# Patient Record
Sex: Male | Born: 1960 | Race: White | Hispanic: No
Health system: Midwestern US, Community
[De-identification: ages and names within clinical notes are randomized; demographics above are authoritative.]

## PROBLEM LIST (undated history)

## (undated) DIAGNOSIS — M419 Scoliosis, unspecified: Secondary | ICD-10-CM

## (undated) DIAGNOSIS — S37019A Minor contusion of unspecified kidney, initial encounter: Secondary | ICD-10-CM

## (undated) DIAGNOSIS — Z8719 Personal history of other diseases of the digestive system: Secondary | ICD-10-CM

## (undated) DIAGNOSIS — K219 Gastro-esophageal reflux disease without esophagitis: Secondary | ICD-10-CM

## (undated) DIAGNOSIS — M549 Dorsalgia, unspecified: Secondary | ICD-10-CM

## (undated) DIAGNOSIS — G8929 Other chronic pain: Secondary | ICD-10-CM

## (undated) DIAGNOSIS — I10 Essential (primary) hypertension: Secondary | ICD-10-CM

## (undated) DIAGNOSIS — E785 Hyperlipidemia, unspecified: Secondary | ICD-10-CM

## (undated) DIAGNOSIS — E119 Type 2 diabetes mellitus without complications: Secondary | ICD-10-CM

## (undated) DIAGNOSIS — J449 Chronic obstructive pulmonary disease, unspecified: Secondary | ICD-10-CM

## (undated) DIAGNOSIS — R3129 Other microscopic hematuria: Secondary | ICD-10-CM

## (undated) DIAGNOSIS — R1031 Right lower quadrant pain: Secondary | ICD-10-CM

## (undated) DIAGNOSIS — M2550 Pain in unspecified joint: Secondary | ICD-10-CM

## (undated) DIAGNOSIS — M5020 Other cervical disc displacement, unspecified cervical region: Secondary | ICD-10-CM

## (undated) DIAGNOSIS — R52 Pain, unspecified: Secondary | ICD-10-CM

## (undated) DIAGNOSIS — R0602 Shortness of breath: Secondary | ICD-10-CM

## (undated) DIAGNOSIS — Z125 Encounter for screening for malignant neoplasm of prostate: Secondary | ICD-10-CM

## (undated) DIAGNOSIS — IMO0002 Reserved for concepts with insufficient information to code with codable children: Secondary | ICD-10-CM

## (undated) DIAGNOSIS — S83289A Other tear of lateral meniscus, current injury, unspecified knee, initial encounter: Secondary | ICD-10-CM

## (undated) DIAGNOSIS — I82401 Acute embolism and thrombosis of unspecified deep veins of right lower extremity: Secondary | ICD-10-CM

## (undated) DIAGNOSIS — M75101 Unspecified rotator cuff tear or rupture of right shoulder, not specified as traumatic: Secondary | ICD-10-CM

## (undated) DIAGNOSIS — R072 Precordial pain: Secondary | ICD-10-CM

## (undated) HISTORY — PX: KNEE ARTHROPLASTY: SHX992

## (undated) HISTORY — PX: SHOULDER ARTHROSCOPY: SHX128

## (undated) HISTORY — PX: KNEE ARTHROSCOPY: SUR90

## (undated) HISTORY — PX: BACK SURGERY: SHX140

## (undated) HISTORY — PX: JOINT REPLACEMENT: SHX530

## (undated) HISTORY — PX: TOTAL SHOULDER ARTHROPLASTY: SHX126

## (undated) HISTORY — PX: HERNIA REPAIR: SHX51

## (undated) HISTORY — PX: CARDIAC CATHETERIZATION: SHX172

---

## 2009-02-22 LAB — URINALYSIS W/O MICRO
Bilirubin: NEGATIVE
Blood: NEGATIVE
Glucose: NEGATIVE MG/DL
Ketone: NEGATIVE MG/DL
Leukocyte Esterase: NEGATIVE
Nitrites: NEGATIVE
Protein: NEGATIVE MG/DL
Specific gravity: 1.01 (ref 1.002–1.030)
Urobilinogen: 0.2 EU/DL (ref 0–1)
pH (UA): 8 (ref 4.5–8.0)

## 2009-02-22 LAB — METABOLIC PANEL, BASIC
Anion gap: 3 mmol/L — ABNORMAL LOW (ref 6–15)
BUN/Creatinine ratio: 11 (ref 7–25)
BUN: 12 MG/DL (ref 7–18)
CO2: 29 MMOL/L (ref 21–32)
Calcium: 8.9 MG/DL (ref 8.5–10.1)
Chloride: 104 MMOL/L (ref 98–107)
Creatinine: 1.1 MG/DL (ref 0.0–1.3)
GFR est AA: >60 mL/min/{1.73_m2} (ref >60)
GFR est non-AA: >60 mL/min/{1.73_m2} (ref >60)
Glucose: 117 MG/DL — ABNORMAL HIGH (ref 70–110)
Potassium: 3.9 MMOL/L (ref 3.5–5.1)
Sodium: 135 MMOL/L — ABNORMAL LOW (ref 136–145)

## 2009-02-22 LAB — CBC WITH AUTOMATED DIFF
ABS. BASOPHILS: 0 10*3/uL (ref 0.0–0.1)
ABS. EOSINOPHILS: 0 10*3/uL (ref 0.0–0.5)
ABS. LYMPHOCYTES: 1.1 10*3/uL (ref 0.8–3.5)
ABS. MONOCYTES: 0.3 10*3/uL — ABNORMAL LOW (ref 0.8–3.5)
ABS. NEUTROPHILS: 7.2 10*3/uL (ref 1.5–8.0)
BASOPHILS: 0 % (ref 0–2)
EOSINOPHILS: 0 % (ref 0–5)
HCT: 47.4 % (ref 41–53)
HGB: 16.4 g/dL (ref 13.5–17.5)
LYMPHOCYTES: 13 % — ABNORMAL LOW (ref 19–48)
MCH: 30.2 PG (ref 27–31)
MCHC: 34.5 g/dL (ref 31–37)
MCV: 87.6 FL (ref 80–100)
MONOCYTES: 4 % (ref 3–9)
MPV: 8.1 FL (ref 5.9–10.3)
NEUTROPHILS: 83 % — ABNORMAL HIGH (ref 40–74)
PLATELET: 198 10*3/uL (ref 130–400)
RBC: 5.41 M/uL (ref 4.7–6.1)
RDW: 13.5 % (ref 11.5–14.5)
WBC: 8.7 10*3/uL (ref 4.5–10.8)

## 2009-09-14 LAB — CK-MB,QUANT.
CK - MB: 3.3 NG/ML (ref 0.0–5.0)
CK-MB Index: 1.8 (ref 0–4)

## 2009-09-14 LAB — CBC WITH AUTOMATED DIFF
ABS. BASOPHILS: 0.1 10*3/uL (ref 0.0–0.1)
ABS. EOSINOPHILS: 0.1 10*3/uL (ref 0.0–0.5)
ABS. LYMPHOCYTES: 2.4 10*3/uL (ref 0.8–3.5)
ABS. MONOCYTES: 0.8 10*3/uL (ref 0.8–3.5)
ABS. NEUTROPHILS: 7.3 10*3/uL (ref 1.5–8.0)
BASOPHILS: 1 % (ref 0–2)
EOSINOPHILS: 1 % (ref 0–5)
HCT: 50.2 % (ref 41–53)
HGB: 17.7 g/dL — ABNORMAL HIGH (ref 13.5–17.5)
LYMPHOCYTES: 22 % (ref 19–48)
MCH: 30.5 PG (ref 27–31)
MCHC: 35.3 g/dL (ref 31–37)
MCV: 86.5 FL (ref 80–100)
MONOCYTES: 8 % (ref 3–9)
MPV: 8.6 FL (ref 5.9–10.3)
NEUTROPHILS: 68 % (ref 40–74)
PLATELET: 218 10*3/uL (ref 130–400)
RBC: 5.8 M/uL (ref 4.7–6.1)
RDW: 13.2 % (ref 11.5–14.5)
WBC: 10.7 10*3/uL (ref 4.5–10.8)

## 2009-09-14 LAB — METABOLIC PANEL, BASIC
Anion gap: 4 mmol/L — ABNORMAL LOW (ref 6–15)
BUN/Creatinine ratio: 17 (ref 7–25)
BUN: 20 MG/DL — ABNORMAL HIGH (ref 7–18)
CO2: 31 MMOL/L (ref 21–32)
Calcium: 9.2 MG/DL (ref 8.5–10.1)
Chloride: 101 MMOL/L (ref 98–107)
Creatinine: 1.2 MG/DL (ref 0.6–1.3)
GFR est AA: >60 mL/min/{1.73_m2} (ref >60)
GFR est non-AA: >60 mL/min/{1.73_m2} (ref >60)
Glucose: 92 MG/DL (ref 70–110)
Potassium: 4.1 MMOL/L (ref 3.5–5.3)
Sodium: 136 MMOL/L (ref 136–145)

## 2009-09-14 LAB — TSH 3RD GENERATION: TSH: 0.84 u[IU]/mL (ref 0.35–3.74)

## 2009-09-14 LAB — CARDIAC PROFILE
CK: 181 U/L (ref 39–308)
CK: 115 U/L (ref 39–308)
Troponin-I, Qt.: <0.02 ng/mL (ref 0.00–0.05)
Troponin-I, Qt.: <0.02 ng/mL (ref 0.00–0.05)

## 2009-09-14 LAB — MAGNESIUM: Magnesium: 2.1 MG/DL (ref 1.8–2.4)

## 2009-09-14 NOTE — ED Notes (Unsigned)
[facility]      PT Name: Jesse Montoya, Jesse Montoya Admitted: 09/14/2009  MR#: [id number] DOB: 08/12/1960  Account #: [id number] Age: 49  Dictator: Farrel Gobble, M.D. Location: 3C 323 01      EMERGENCY DEPARTMENT ADMISSION    CHIEF COMPLAINT:  Questionable stroke.    HISTORY OF PRESENT ILLNESS:  49 year old male who states two days ago he had an episode where he  developed some numbness on the left arm and some numbness on the  right side of his face. He had some trouble with speech. He felt  confused, nauseated. He says his symptoms have resolved other than  some numbness and tingling in the right side of the face.  Questionable mild headache. No chest pain. Some nausea. He never  had any focal weakness. Symptoms were mild.    PAST MEDICAL HISTORY:  Hypertension.    SOCIAL HISTORY:  Positive smoking.    FAMILY HISTORY:  Noncontributory.    VITAL SIGNS:  Within normal limits.    REVIEW OF SYSTEMS:  No fever, no vomiting. Positive nausea. No chest pain, not short  of breath. All other systems reviewed and negative except as  mentioned above.    PHYSICAL EXAMINATION:  GENERAL: The patient is alert, answers questions appropriately, in  no distress. HEAD: Normocephalic and atraumatic. EYES: Sclerae  nonicteric. No discharge. NECK: Supple, no JVD. No carotid bruits  bilaterally. SKIN: No rashes. JOINTS: Nonswollen. HEART:  Regular rate, rhythm, no murmur, gallop or rub. LUNGS: Clear to  auscultation bilaterally with equal breath sounds bilaterally.  ABDOMEN: Bowel sounds positive, soft, nontender to palpation,  nondistended, no rebound, no guarding, no masses, no peritoneal  signs. EXTREMITIES: No cyanosis, no clubbing, no edema.  NEUROLOGICAL EXAM: Cranial nerves II through XII reveals normal  function except for a slight decreased in light touch sensation on  the right side of the face. Normal on the left. Equal grip strength  bilaterally. Equal hip flexion strength bilaterally.    LABORATORY DATA:   CBC within normal limits. Electrolytes show no significant  abnormalities. Cardiac enzymes pending at the time of dictation.  EKG shows sinus rhythm, no acute signs of ischemia. QRS complexes  are narrow. No ST segment elevations. Heart rate 100. CT scan of  the head with no contrast negative.    HOSPITAL COURSE:  The patient was given aspirin. Condition was discussed with Dr. Leretha Dykes who agreed to admit.    DIAGNOSIS:  Transient ischemic attack.    DISPOSITION:  The patient was admitted to the floor for further evaluation and  treatment.        __________________________________  Farrel Gobble, M.D.    BV:sp DD: 09/14/2009 08:11:45 DT: 09/14/2009 09:35:39 Job ID:  [id number]  CC:          Document #: [id number]

## 2009-09-14 NOTE — H&P (Signed)
[facility]      PT Name: Jesse Montoya, Jesse Montoya Admitted: 09/14/2009  MR#: [id number] DOB: [birth year]  Account #: [id number] Age: 49  Dictator: Manfred Arch, MD Location: 3C 323 01        HISTORY AND PHYSICAL    CHIEF COMPLAINT:  Numbness, tingling right arm, chest tightness.    HISTORY OF PRESENT ILLNESS:  This is the case of a 49 year old male, White man, who came into the  hospital with a two day history of right arm pain, right side face  numbness, tingling, right arm numbness and tingling, tingling around  the mouth and some subconjunctival hemorrhage. The patient said the  story started two days. He had some blood in his eye, right  conjunctival hemorrhage which resolved right away and then after  that, he started to have right arm numbness, tingling and then that  transferred around his mouth and then to his left side. He also  described chest tightness without any pain. He said there was  association with lightheadedness. Nothing makes his symptoms worse  and nothing makes it better. He had lightheadedness without any  black out and loss of consciousness. Other than that he denies any  nausea, vomiting, diarrhea, shortness of breath, sweating or any  other associated symptoms.    PAST MEDICAL HISTORY:  Significant for:  1. Hypertension.  2. Hyperlipidemia.  3. Chronic back pain.  4. Gastroesophageal reflux disease.  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    MEDICATIONS:  He is not sure of his medication.  1. He said he is taking gastroesophageal reflux disease pills, blood  pressure medication. He is not sure of the name.  2. Cholesterol pills-he is not sure of the name.  3. Lortab 7.5.  4. Gabapentin 800 mg orally four times a day.  5. Flexeril orally three times a day.    ALLERGIES:  HE IS ALLERGIC TO DARVOCET, PERCOCET, ROBAXIN AND TORADOL.    SURGICAL HISTORY:  Right arm surgical history.    FAMILY HISTORY:  Significant for brain tumor and also his dad has history of stroke.   His brother had a brain tumor at a younger age.        REVIEW OF SYSTEMS:  Comprehensive ten point review of systems was done. It was negative  except for what was mentioned in the History and Physical.    PHYSICAL EXAMINATION:  GENERAL: The patient is not in apparent distress, having his  breakfast. VITAL SIGNS: His vitals are the following- blood  pressure 143/70, heart rate 106, respiratory rate 16, temperature  98.4, satting 94% on room air. HEAD, EYES, EARS, NOSE AND THROAT:  Pupils are equal, round and reactive to light and accommodation.  Extraocular movements are intact. CARDIOVASCULAR: S1/S2, no  murmur, rub or gallop, regular rate and rhythm. CHEST: Clear to  auscultation bilaterally. Normal respiratory efforts. ABDOMEN: Soft,  no tenderness, no rigidity, no hepatosplenomegaly. GENITOURINARY:  No CVA, no suprapubic tenderness. MUSCULOSKELETAL: No joint  deformity, no swelling. NEUROLOGICAL: Cranial nerves II-XII intact.  Strength 5/5 all extremities. Deep reflexes +2, symmetrical.  GENITOURINARY: No CVA, no suprapubic tenderness.  HEMATOLOGY/ONCOLOGY: No splenomegaly, no petechiae.    DIAGNOSTIC STUDIES:  Blood workup done for him-CT scan negative. WBC 10.7, hemoglobin  17.7, platelets are 218. Calcium 9.2, glucose 92, Sodium 136,  Potassium is 4.1 and Chloride 101, CO2 is 31. CK 181, CKMB 30.3.  Troponin less than 0.02. Cardiac enzymes-first set negative.  Magnesium is  normal. EKG-no significant changes. Chest x-ray not  done, we will do it.    ASSESSMENT AND PLAN:  1. Questionable Transient ischemic attack. I am going to do MRI for  the patient, carotid ultrasound and Two-D echo.  2. Chest tightness, rule out coronary artery disease. I am going to  do an exercise stress test with nuclear imaging.  3. For the transient ischemic attack will put the patient on Aspirin  325 daily. I will check fasting lipid profile. I will check cycling  his cardiac enzymes. I will check his thyroid stimulating hormone.   I will check his electrolytes, sed rate.  4. History of hypertension. We will confirm with pharmacist the  medication and will resume it. For now, I will put him on  Labetalol.  5. Hypercholesterolemia. Will check cholesterol level. Will check  his medication.  6. Deep venous thrombosis prophylaxis. I will put him on Lovenox.  7. Patient will be admitted to telemetry.         Electronically signed by Manfred Arch, M.D.  on 09/25/2009 01:25:03      _______________________________  Manfred Arch, M.D.    MA:ls DD: 09/14/2009 10:43:28 DT: 09/15/2009 07:46:11 Job ID:  [id number]  CC:          Document #: [id number]

## 2009-09-15 LAB — CARDIAC PROFILE
CK: 100 U/L (ref 39–308)
Troponin-I, Qt.: <0.02 ng/mL (ref 0.00–0.05)

## 2009-09-15 LAB — LIPID PANEL
Cholesterol, total: 138 MG/DL (ref <200)
HDL Cholesterol: 28 MG/DL — ABNORMAL LOW (ref 32–96)
LDL, calculated: 84.24 MG/DL (ref <130)
Triglyceride: 161 MG/DL — ABNORMAL HIGH (ref <150)
VLDL, calculated: 32.2 MG/DL — ABNORMAL HIGH (ref 5–32)

## 2009-09-15 LAB — METABOLIC PANEL, COMPREHENSIVE
A-G Ratio: 1.2 (ref 1.2–2.2)
ALT (SGPT): 39 U/L (ref 30–65)
AST (SGOT): 19 U/L (ref 15–37)
Albumin: 3.5 g/dL (ref 3.4–5.0)
Alk. phosphatase: 64 U/L (ref 50–136)
Anion gap: 8 mmol/L (ref 6–15)
BUN/Creatinine ratio: 17 (ref 7–25)
BUN: 17 MG/DL (ref 7–18)
Bilirubin, total: 0.3 MG/DL (ref 0.2–1.0)
CO2: 27 MMOL/L (ref 21–32)
Calcium: 8.4 MG/DL — ABNORMAL LOW (ref 8.5–10.1)
Chloride: 106 MMOL/L (ref 98–107)
Creatinine: 1 MG/DL (ref 0.6–1.3)
GFR est AA: >60 mL/min/{1.73_m2} (ref >60)
GFR est non-AA: >60 mL/min/{1.73_m2} (ref >60)
Globulin: 2.9 g/dL (ref 2.4–3.5)
Glucose: 97 MG/DL (ref 70–110)
Potassium: 3.8 MMOL/L (ref 3.5–5.3)
Protein, total: 6.4 g/dL (ref 6.4–8.2)
Sodium: 141 MMOL/L (ref 136–145)

## 2009-09-15 LAB — CBC WITH AUTOMATED DIFF
ABS. BASOPHILS: 0 10*3/uL (ref 0.0–0.1)
ABS. EOSINOPHILS: 0.1 10*3/uL (ref 0.0–0.5)
ABS. LYMPHOCYTES: 2.1 10*3/uL (ref 0.8–3.5)
ABS. MONOCYTES: 0.4 10*3/uL — ABNORMAL LOW (ref 0.8–3.5)
ABS. NEUTROPHILS: 2.8 10*3/uL (ref 1.5–8.0)
BASOPHILS: 1 % (ref 0–2)
EOSINOPHILS: 2 % (ref 0–5)
HCT: 47.3 % (ref 41–53)
HGB: 16.4 g/dL (ref 13.5–17.5)
LYMPHOCYTES: 38 % (ref 19–48)
MCH: 30.6 PG (ref 27–31)
MCHC: 34.7 g/dL (ref 31–37)
MCV: 88.2 FL (ref 80–100)
MONOCYTES: 7 % (ref 3–9)
MPV: 8.4 FL (ref 5.9–10.3)
NEUTROPHILS: 52 % (ref 40–74)
PLATELET: 162 10*3/uL (ref 130–400)
RBC: 5.36 M/uL (ref 4.7–6.1)
RDW: 13.4 % (ref 11.5–14.5)
WBC: 5.4 10*3/uL (ref 4.5–10.8)

## 2009-09-15 LAB — SED RATE (ESR): Sed rate (ESR): 1 MM/HR (ref 0–15)

## 2009-09-15 LAB — PHOSPHORUS: Phosphorus: 3.4 MG/DL (ref 2.5–4.9)

## 2009-09-15 LAB — MAGNESIUM: Magnesium: 2.3 MG/DL (ref 1.8–2.4)

## 2009-09-15 NOTE — Procedures (Signed)
Procedures  filed by Precious Reel at 09/16/09 1258                Author: Precious Reel  Service: --  Author Type: Physician       Filed: 09/16/09 1258  Date of Service: 09/15/09 1229  Status: Addendum          Editor: Precious Reel            Procedure Orders        1. 2D ECHO COMPLETE ADULT [[id number]] ordered by  at 09/15/09 1229                         <!--EPICS-->                           [facility]<BR> <BR> <BR> PT Name:  Jesse, Montoya           Admitted:  09/14/2009<BR> MR#:  [id number]                      DOB:  07-05-1960<BR> Account #:  [id number]             Age:  49<BR> Dictator:  Precious Reel, MD       Location:  3C 323 01<BR> <BR> <BR> Age: 4Y  Room #:   323<BR> Performing Technician:    Marcellina Millin    Interpreting Physician:<BR>  Nasif Bos<BR> <BR>  M-MODE AND TWO DIMENSIONAL<BR> ECHOCARDIOGRAPHIC REPORT<BR> <BR> CLINICAL INFORMATION:  Transient ischemic attack<BR> <BR> DimensionsNormal C.M.DimensionsNormal C.M.Mitral<BR> ValueNormal C.M.RVID(ed)2.30.9 - 2.6IVS(ed)0.90.6  - 1.1E-F slope2.5<BR> - 11.7LIVD(ed)4.13.5 - 5.7IVS(es)1.30.8 - 2.0Excursion1.0 -<BR> 2.6LIVD(es)2.6LVPW(ed)0.80.6 - 1.1Aortic Cusp Separation2.41.5 -<BR> 2.6LA(es)3.61.9 - 4.0LVPW(es)1.40.9 - 2.1E Point<BR> Separation.07LVOT2.52.0 - 4.0E F50-55%50% or  &gt;AORTA2.32.0 -<BR> 3.7(ed)Ratio1.5Fractional shortening25% - 52%<BR> FINDINGS:<BR> The study is of good diagnostic quality.  The left and right atria<BR> are within normal limits in size.  The right ventricle is within<BR> normal size and functions  normally.  The left ventricle is within<BR> normal size and functions normally.  The ejection fraction is 55%.<BR> There is no left ventricular hypertrophy or outflow tract<BR> obstruction.  E to A ratio is reversed.<BR> <BR> Tricuspid valve is structurally  normal.  There is mild tricuspid<BR> regurgitation.  Right ventricular systolic pressure is within normal<BR> limits.  Pulmonic valve is  structurally normal.  There is trace<BR> pulmonic insufficiency.  The mitral leaflets are structurally<BR> normal.   There is trace mitral insufficiency.  Aortic valve is<BR> trileaflet and functions normally.  There is no pericardial<BR> effusion, tumor, thrombus or vegetation.  Aortic root is within<BR> normal limits in size.<BR> <BR> CONCLUSION:<BR> 1. Normal left  ventricular systolic function.  Ejection fraction is<BR> 55%.<BR> 2. Mild tricuspid insufficiency with normal right ventricular<BR> systolic pressure.<BR> 3. Trace mitral insufficiency.<BR> 4. Mild grade I left ventricular diastolic dysfunction without<BR>  evidence of left ventricular hypertrophy or outflow tract<BR> obstruction.<BR> <BR> <BR>  Electronically signed by Precious Reel, M.D.<BR> on 09/16/2009 12:57:35<BR> <BR> <BR> ___________________________________<BR> Precious Reel, M.D.<BR> <BR> TD:ls   DD: 09/15/2009 12:29:36  DT: 09/16/2009 10:35:41  Job ID:<BR> [id number]<BR> <BR> CC:<BR> <BR> <BR> <BR> <BR> Document #:  [id number]<BR> <!--EPICE-->

## 2009-09-15 NOTE — Procedures (Signed)
Procedures  filed by Elayne Snare at 09/16/09 1257                Author: Elayne Snare  Service: --  Author Type: Physician       Filed: 09/16/09 1257  Date of Service: 09/15/09 0000  Status: Addendum          Editor: Elayne Snare            Procedure Orders        1. STRESS TEST CARDIAC [[id number]] ordered by  at 09/15/09 0000                         <!--EPICS-->                           [facility]<BR> <BR> <BR> PT Name:  Jesse Montoya, Jesse Montoya           Admitted:  09/14/2009<BR> MR#:  [id number]                      DOB:  March 15, 1960<BR> Account #:  [id number]             Age:  49<BR> Dictator:  Elayne Snare, MD      Location:  3C 323 01<BR> <BR> <BR> <BR>                 Cardiology - GXT - DRUG INDUCED TEST<BR> Reason for GXT:  Transient ischemic  attack/chest pain.<BR> Medications:   See list.<BR> Allergies:     Darvocet, Percocet, Robaxin, Toradol, Vicodin.<BR> INTERPRETATION OF STRESS TEST:<BR> Protocol:      Lexiscan Myoview    Age: 49Y  Height:   6'0&quot; Weight:<BR> 215  Sex: Male Race:<BR>  <BR> * * * Lexiscan 0.4  MG ADMIN. * * *<BR> <BR> BLOOD PRESSURE      HEART RATE               MET<BR> REST:  110/75                 REST:  82<BR> 1 MIN:    120/70              95<BR> 2 MIN: 120/70            108<BR> 3 MIN: 120/70            106<BR> 4  MIN: 120/80            106<BR> MIN:<BR> PEAK:                    PEAK:<BR> <BR> * * * POST EXERCISE RESPONSE * * *<BR> <BR> BLOOD PRESSURE      HEART RATE<BR> 1 MIN:    120/80              100<BR> 2 MIN: 120/80            100<BR> 3 MIN: 120/80             96<BR> 4 MIN:    124/80              97<BR> <BR> <BR> HEART - - PREDICTED MHR:      MHR ACHIEVED:   %   PEAK HR ACHIEVED:<BR> TOTAL TIME:         TARGET (85% MHR):        STAGE:         TIME:<BR> <  BR> SYMPTOMS/REASON FOR STOPPING: No chest pain.<BR>  PRE-EXERCISE ECG:   Sinus rhythm, normal.<BR> EKG RESPONSE:  No significant ST segment changes noted.   No<BR> arrhythmias.<BR> PEAK ACTIVITY  LEVEL:       METS.   MAXIMUM 02 CONSUMPTION (VO2):<BR> INDEX OF CARDIAC WORK:        FITNESS CLASSIFICATION:<BR>  <BR> INTERPRETATION:<BR> 1. Negative electrocardiographic test in response to Lexiscan.<BR> 2. Myoview distribution report is pending.<BR> <BR> <BR>  Electronically signed by Precious Reel, M.D.<BR> on 09/16/2009 12:56:39<BR> <BR> _______________________<BR>  Precious Reel, M.D.<BR> <BR> TD:ls  DD: 09/15/2009 00:00:00  DT:  09/15/2009 13:51:07<BR> Corrected report 09/15/2009/ls<BR> CC:<BR> <BR> <BR> <BR> <BR> Document #:  [id number]<BR> <!--EPICE-->

## 2009-09-15 NOTE — Procedures (Addendum)
[facility]      PT Name: Jesse Montoya, Jesse Montoya Admitted: 09/14/2009  MR#: [id number] DOB: [birth year]  Account #: [id number] Age: 49  Dictator: Elayne Snare, MD Location: 3C 323 01         Cardiology - GXT - DRUG INDUCED TEST  Reason for GXT: Transient ischemic attack/chest pain.  Medications: See list.  Allergies: Darvocet, Percocet, Robaxin, Toradol, Vicodin.  INTERPRETATION OF STRESS TEST:  Protocol: Lexiscan Myoview Age: 49Y Height: 6'0" Weight:  215 Sex: Male Race:    * * * Lexiscan 0.4 MG ADMIN. * * *    BLOOD PRESSURE HEART RATE MET  REST: 110/75 REST: 82  1 MIN: 120/70 95  2 MIN: 120/70 108  3 MIN: 120/70 106  4 MIN: 120/80 106  MIN:  PEAK: PEAK:    * * * POST EXERCISE RESPONSE * * *    BLOOD PRESSURE HEART RATE  1 MIN: 120/80 100  2 MIN: 120/80 100  3 MIN: 120/80 96  4 MIN: 124/80 97      HEART - - PREDICTED MHR: MHR ACHIEVED: % PEAK HR ACHIEVED:  TOTAL TIME: TARGET (85% MHR): STAGE: TIME:    SYMPTOMS/REASON FOR STOPPING: No chest pain.  PRE-EXERCISE ECG: Sinus rhythm, normal.  EKG RESPONSE: No significant ST segment changes noted. No  arrhythmias.  PEAK ACTIVITY LEVEL: METS. MAXIMUM 02 CONSUMPTION (VO2):  INDEX OF CARDIAC WORK: FITNESS CLASSIFICATION:    INTERPRETATION:  1. Negative electrocardiographic test in response to Lexiscan.  2. Myoview distribution report is pending.       Electronically signed by Precious Reel, M.D.  on 09/16/2009 12:56:39    _______________________  Precious Reel, M.D.    TD:ls DD: 09/15/2009 00:00:00 DT: 09/15/2009 13:51:07  Corrected report 09/15/2009/ls  CC:          Document #: [id number]

## 2009-09-15 NOTE — Discharge Summary (Signed)
[facility]      PT Name: Jesse Montoya, Jesse Montoya Admitted: 09/14/2009  MR#: [id number] DOB: 1960/07/30  Account #: [id number] Age: 49  Dictator: Manfred Arch, MD Location: 3C 323 01      DISCHARGE SUMMARY      ADMITTING DIAGNOSES:  1. Questionable transient ischemic attack.  2. Chest tightness.  3. Rule out coronary artery disease.  4. History of hypertension.  5. Hypercholesterolemia.  6. Chronic back pain.    DISCHARGE DIAGNOSES:  1. Atypical numbness and tingling, transient ischemic attack has  been ruled out with negative carotid ultrasound, negative magnetic  resonance imaging, and negative stress test, Two-D echocardiogram  pending.  2. Hypertension.  3. Dyslipidemia.  4. Chronic back pain with request for pain medication and drug  seeking behavior.    HISTORY OF PRESENT ILLNESS AND HOSPITAL COURSE:  The patient is a 36 -year-old white male who came into the hospital  with a two days history of right arm pain, numbness, and tingling.  The patient mentioned that his symptoms improved within twenty-four  hours after he resumed his pain medication. The patient had an MRI  of the brain which did not show any abnormality except sinusitis  which he has had for a long time. Also a stress test has been done  for him which the results are pending - please refer to the results.  Also a Two-D echocardiogram has been done. Carotid ultrasound has  been done that was negative. A decision was made to discharge the  patient if the stress test is negative - please refer to the stress  test results.    DISCHARGE MEDICATIONS:  1. Lortab 7/325 mg which is home medication but is written only for  seven days.  2. Flexeril 10 mg b.i.d.  3. Famotidine 20 mg q daily.  4. Lisinopril 10 mg q a.m.  5. Nexium q daily.  6. TriCor 54 mg q a.m.    FOLLOWUP INSTRUCTIONS:  1. The patient needs to followup with is primary care physician.  2. The patient was advised to quit smoking and we recommended   programs for him. He is willing to do that as an outpatient.  3. 2 D echo should be followed with PCP which show Grade I diastolic  dysfunction          PHYSICAL EXAMINATION:  Today the patient is doing very well. Heart rate 83, temperature  97, respiratory rate 18, pulse oximeter 97%, and blood pressure  110/75. HEENT: Pupils equal, round and reactive to light and  accommodation. Extraocular movements are intact. CARDIOVASCULAR:  S1 and S2; no murmur, rub, or gallop; regular rate and rhythm.  CHEST: Clear to auscultation, normal respiratory efforts. ABDOMEN:  Soft, non tenderness, no rigidity. NEUROLOGIC: Cranial nerves  II-XII intact, strength 5/5 in all extremities.           Electronically signed by Manfred Arch, M.D.  on 09/25/2009 01:25:00      __________________________________  Manfred Arch, M.D.    [facility] DD: 09/15/2009 12:26:15 DT: 09/19/2009 10:43:19 Job ID:  [id number]  CC:            Document #: [id number]

## 2009-09-15 NOTE — Procedures (Addendum)
[facility]      PT Name: Jesse Montoya, Jesse Montoya Admitted: 09/14/2009  MR#: [id number] DOB: 06/28/1960  Account #: [id number] Age: 49  Dictator: Precious Reel, MD Location: 3C 323 01      Age: 49Y Room #: 323  Performing Technician: Marcellina Millin Interpreting Physician:  Precious Reel     M-MODE AND TWO DIMENSIONAL  ECHOCARDIOGRAPHIC REPORT    CLINICAL INFORMATION: Transient ischemic attack    DimensionsNormal C.M.DimensionsNormal C.M.Mitral  ValueNormal C.M.RVID(ed)2.30.9 - 2.6IVS(ed)0.90.6 - 1.1E-F slope2.5  - 11.7LIVD(ed)4.13.5 - 5.7IVS(es)1.30.8 - 2.0Excursion1.0 -  2.6LIVD(es)2.6LVPW(ed)0.80.6 - 1.1Aortic Cusp Separation2.41.5 -  2.6LA(es)3.61.9 - 4.0LVPW(es)1.40.9 - 2.1E Point  Separation.07LVOT2.52.0 - 4.0E F50-55%50% or >AORTA2.32.0 -  3.7(ed)Ratio1.5Fractional shortening25% - 52%  FINDINGS:  The study is of good diagnostic quality. The left and right atria  are within normal limits in size. The right ventricle is within  normal size and functions normally. The left ventricle is within  normal size and functions normally. The ejection fraction is 55%.  There is no left ventricular hypertrophy or outflow tract  obstruction. E to A ratio is reversed.    Tricuspid valve is structurally normal. There is mild tricuspid  regurgitation. Right ventricular systolic pressure is within normal  limits. Pulmonic valve is structurally normal. There is trace  pulmonic insufficiency. The mitral leaflets are structurally  normal. There is trace mitral insufficiency. Aortic valve is  trileaflet and functions normally. There is no pericardial  effusion, tumor, thrombus or vegetation. Aortic root is within  normal limits in size.    CONCLUSION:  1. Normal left ventricular systolic function. Ejection fraction is  55%.  2. Mild tricuspid insufficiency with normal right ventricular  systolic pressure.  3. Trace mitral insufficiency.  4. Mild grade I left ventricular diastolic dysfunction without   evidence of left ventricular hypertrophy or outflow tract  obstruction.       Electronically signed by Precious Reel, M.D.  on 09/16/2009 12:57:35      ___________________________________  Precious Reel, M.D.    TD:ls DD: 09/15/2009 12:29:36 DT: 09/16/2009 10:35:41 Job ID:  [id number]    CC:          Document #: [id number]

## 2009-09-19 LAB — CULTURE, BLOOD,  2ND DRAW: Culture result:: NO GROWTH

## 2009-09-19 LAB — CULTURE, BLOOD: Culture result:: NO GROWTH

## 2009-10-16 ENCOUNTER — Encounter

## 2009-10-30 MED ADMIN — methacholine (PROVOCHOLINE) 0.25 mg/ml inhalation solution 0.25 mg: RESPIRATORY_TRACT | @ 17:00:00 | NDC 99999781101

## 2009-10-30 MED ADMIN — methacholine (PROVOCHOLINE) 25 mg/ml inhalation solution 25 mg: RESPIRATORY_TRACT | @ 17:00:00 | NDC 99999781125

## 2009-10-30 MED ADMIN — methacholine (provocholine) 0.025 mg/ml inhalation solution 0.025 mg: RESPIRATORY_TRACT | @ 16:00:00 | NDC 99999781100

## 2009-10-30 MED ADMIN — methacholine (PROVOCHOLINE) 2.5 mg/ml inhalation solution 2.5 mg: RESPIRATORY_TRACT | @ 17:00:00 | NDC 99999781112

## 2009-10-30 MED ADMIN — methacholine (PROVOCHOLINE) 10 mg/ml inhalation solution 10 mg: RESPIRATORY_TRACT | @ 17:00:00 | NDC 99999781150

## 2009-11-06 LAB — DRUG SCREEN, URINE
AMPHETAMINES: NEGATIVE
BARBITURATES: NEGATIVE
BENZODIAZEPINES: NEGATIVE
COCAINE: NEGATIVE
METHADONE: NEGATIVE
OPIATES: POSITIVE
PCP(PHENCYCLIDINE): NEGATIVE
THC (TH-CANNABINOL): NEGATIVE
TRICYCLICS: NEGATIVE

## 2009-11-06 LAB — METABOLIC PANEL, BASIC
Anion gap: 5 mmol/L — ABNORMAL LOW (ref 6–15)
BUN/Creatinine ratio: 13 (ref 7–25)
BUN: 14 MG/DL (ref 7–18)
CO2: 29 MMOL/L (ref 21–32)
Calcium: 9.3 MG/DL (ref 8.5–10.1)
Chloride: 103 MMOL/L (ref 98–107)
Creatinine: 1.1 MG/DL (ref 0.6–1.3)
GFR est AA: >60 mL/min/{1.73_m2} (ref >60)
GFR est non-AA: >60 mL/min/{1.73_m2} (ref >60)
Glucose: 110 MG/DL (ref 70–110)
Potassium: 4.2 MMOL/L (ref 3.5–5.3)
Sodium: 137 MMOL/L (ref 136–145)

## 2009-11-06 LAB — CBC WITH AUTOMATED DIFF
ABS. BASOPHILS: 0 10*3/uL (ref 0.0–0.1)
ABS. EOSINOPHILS: 0.1 10*3/uL (ref 0.0–0.5)
ABS. LYMPHOCYTES: 1.7 10*3/uL (ref 0.8–3.5)
ABS. MONOCYTES: 0.6 10*3/uL — ABNORMAL LOW (ref 0.8–3.5)
ABS. NEUTROPHILS: 4.6 10*3/uL (ref 1.5–8.0)
BASOPHILS: 1 % (ref 0–2)
EOSINOPHILS: 2 % (ref 0–5)
HCT: 46.1 % (ref 41–53)
HGB: 16.1 g/dL (ref 13.5–17.5)
LYMPHOCYTES: 24 % (ref 19–48)
MCH: 30.8 PG (ref 27–31)
MCHC: 34.9 g/dL (ref 31–37)
MCV: 88.4 FL (ref 80–100)
MONOCYTES: 8 % (ref 3–9)
MPV: 8.1 FL (ref 5.9–10.3)
NEUTROPHILS: 66 % (ref 40–74)
PLATELET: 183 10*3/uL (ref 130–400)
RBC: 5.21 M/uL (ref 4.7–6.1)
RDW: 13.8 % (ref 11.5–14.5)
WBC: 6.9 10*3/uL (ref 4.5–10.8)

## 2009-11-06 LAB — URINALYSIS W/ RFLX MICROSCOPIC
Bilirubin: NEGATIVE
Blood: NEGATIVE
Glucose: NEGATIVE MG/DL
Ketone: NEGATIVE MG/DL
Leukocyte Esterase: NEGATIVE
Nitrites: NEGATIVE
Protein: NEGATIVE MG/DL
Specific gravity: 1.015 (ref 1.002–1.030)
Urobilinogen: 0.2 EU/DL (ref 0–1)
pH (UA): 5.5 (ref 4.5–8.0)

## 2009-11-06 MED ORDER — MORPHINE 5 MG/ML IJ SOLN
5 mg/mL | Freq: Once | INTRAMUSCULAR | Status: AC
Start: 2009-11-06 — End: 2009-11-06
  Administered 2009-11-06: 15:00:00 via INTRAVENOUS

## 2009-11-06 MED ORDER — ONDANSETRON (PF) 4 MG/2 ML INJECTION
4 mg/2 mL | INTRAMUSCULAR | Status: AC
Start: 2009-11-06 — End: 2009-11-06
  Administered 2009-11-06: 15:00:00 via INTRAVENOUS

## 2009-11-06 NOTE — ED Provider Notes (Signed)
Patient is a 49 y.o. male presenting with flank pain. The history is provided by the patient.   Flank Pain   This is a new (Pt c/o dysurai and suprapubic discomfort as well as L flank pain beginning nearly 2 weeks ago, shortly after having sex with his ex-wife.  Denies urethral DC  or penile pain. No testicular pain.  Dwenies fever or chills.) problem. The problem has not changed since onset. The problem occurs constantly. The pain is associated with no known injury. The pain does not radiate. Associated symptoms include abdominal pain, dysuria and pelvic pain. Pertinent negatives include no chest pain, no fever, no headaches, no bladder incontinence, no paresthesias, no paresis and no weakness.        Past Medical History   Diagnosis Date   ??? Hypertension    ??? Asthma      bronchitis   ??? Gastrointestinal disorder      acid reflux   ??? Stroke      TIA x 5          Past Surgical History   Procedure Date   ??? Hx orthopaedic      right shoulder           No family history on file.     History   Social History   ??? Marital Status: Single     Spouse Name: N/A     Number of Children: N/A   ??? Years of Education: N/A   Occupational History   ??? Not on file.   Social History Main Topics   ??? Smoking status: Current Everyday Smoker -- 0.5 packs/day for 18 years   ??? Smokeless tobacco: Not on file   ??? Alcohol Use: Yes      seldom 12 pk per month   ??? Drug Use: No   ??? Sexually Active:    Other Topics Concern   ??? Not on file   Social History Narrative   ??? No narrative on file                    ALLERGIES: Tramadol, Percocet, Toradol, Robaxin and Other      Review of Systems   Constitutional: Negative for fever and chills.   HENT: Negative for ear pain, congestion and postnasal drip.    Eyes: Negative for pain.   Respiratory: Negative for cough and shortness of breath.    Cardiovascular: Negative for chest pain and palpitations.   Gastrointestinal: Positive for abdominal pain. Negative for nausea and vomiting.    Genitourinary: Positive for dysuria, flank pain and pelvic pain. Negative for bladder incontinence and hematuria.   Musculoskeletal: Negative for back pain.   Skin: Negative for rash.   Neurological: Negative for weakness, headaches and paresthesias.       Filed Vitals:    11/06/09 0757   BP: 115/76   Pulse: 87   Temp: 97.8 ??F (36.6 ??C)   Resp: 16   Height: 6' (1.829 m)   Weight: 214 lb (97.07 kg)   SpO2: 97%              Physical Exam   Nursing note and vitals reviewed.  Constitutional: He is oriented to person, place, and time. He appears well-developed and well-nourished.   HENT:   Head: Normocephalic.   Right Ear: External ear normal.   Left Ear: External ear normal.   Nose: Nose normal.   Mouth/Throat: Oropharynx is clear and moist.   Eyes: EOM are  normal. Pupils are equal, round, and reactive to light.   Neck: Normal range of motion. Neck supple.   Cardiovascular: Normal rate, regular rhythm and normal heart sounds.    Pulmonary/Chest: Effort normal. He has no wheezes. He has no rales.   Abdominal: Soft. Bowel sounds are normal. No tenderness.   Musculoskeletal: Normal range of motion. He exhibits no edema.   Lymphadenopathy:     He has no cervical adenopathy.   Neurological: He is alert and oriented to person, place, and time.   Skin: Skin is warm and dry. No rash noted.   Psychiatric: He has a normal mood and affect.        MDM     Amount and/or Complexity of Data Reviewed:   Clinical lab tests:  Ordered and reviewed      Procedures

## 2009-11-06 NOTE — ED Notes (Signed)
Sitting in room requesting pain meds and coffee. Dr. Sabino Gasser informed. No new orders. Transported to ct per BS

## 2009-11-06 NOTE — ED Notes (Signed)
Results for orders placed during the hospital encounter of 11/06/09   CBC WITH AUTOMATED DIFF   Component Value Range   ??? WBC 6.9  4.5 - 10.8 (K/uL)   ??? RBC 5.21  4.7 - 6.1 (M/uL)   ??? HGB 16.1  13.5 - 17.5 (g/dL)   ??? HCT 46.1  41 - 53 (%)   ??? MCV 88.4  80 - 100 (FL)   ??? MCH 30.8  27 - 31 (PG)   ??? MCHC 34.9  31 - 37 (g/dL)   ??? RDW 13.8  11.5 - 14.5 (%)   ??? PLATELET 183  130 - 400 (K/uL)   ??? MPV 8.1  5.9 - 10.3 (FL)   ??? NEUTROPHILS 66  40 - 74 (%)   ??? LYMPHOCYTES 24  19 - 48 (%)   ??? MONOCYTES 8  3 - 9 (%)   ??? EOSINOPHILS 2  0 - 5 (%)   ??? BASOPHILS 1  0 - 2 (%)   ??? ABSOLUTE NEUTS 4.6  1.5 - 8.0 (K/UL)   ??? ABSOLUTE LYMPHS 1.7  0.8 - 3.5 (K/UL)   ??? ABSOLUTE MONOS 0.6 (*) 0.8 - 3.5 (K/UL)   ??? ABSOLUTE EOSINS 0.1  0.0 - 0.5 (K/UL)   ??? ABSOLUTE BASOS 0.0  0.0 - 0.1 (K/UL)   ??? DF AUTOMATED     METABOLIC PANEL, BASIC   Component Value Range   ??? Sodium 137  136 - 145 (MMOL/L)   ??? Potassium 4.2  3.5 - 5.3 (MMOL/L)   ??? Chloride 103  98 - 107 (MMOL/L)   ??? CO2 29  21 - 32 (MMOL/L)   ??? Anion gap 5 (*) 6 - 15 (mmol/L)   ??? Glucose 110  70 - 110 (MG/DL)   ??? BUN 14  7 - 18 (MG/DL)   ??? Creatinine 1.1  0.6 - 1.3 (MG/DL)   ??? BUN/Creatinine ratio 13  7 - 25 ( )   ??? GFR est AA >60  >60 (ml/min/1.22m2)   ??? GFR est non-AA >60  >60 (ml/min/1.60m2)   ??? Calcium 9.3  8.5 - 10.1 (MG/DL)   URINALYSIS W/ RFLX MICROSCOPIC   Component Value Range   ??? Color YELLOW     ??? Appearance CLEAR     ??? Specific gravity 1.015  1.002 - 1.030 ( )   ??? pH 5.5  4.5 - 8.0 ( )   ??? Protein NEGATIVE   (MG/DL)   ??? Glucose NEGATIVE   (MG/DL)   ??? Ketone NEGATIVE   (MG/DL)   ??? Bilirubin NEGATIVE      ??? Blood NEGATIVE      ??? Urobilinogen 0.2  0 - 1 (EU/DL)   ??? Nitrites NEGATIVE      ??? Leukocyte Esterase NEGATIVE      DRUG SCREEN, URINE   Component Value Range   ??? METHADONE NEGATIVE      ??? PCP(PHENCYCLIDINE) NEGATIVE      ??? BENZODIAZEPINE NEGATIVE      ??? COCAINE NEGATIVE      ??? AMPHETAMINE NEGATIVE      ??? OPIATES POSITIVE     ??? BARBITURATES NEGATIVE       ??? TRICYCLICS NEGATIVE      ??? THC (TH-CANNABINOL) NEGATIVE

## 2009-11-06 NOTE — ED Notes (Signed)
I have reviewed discharge instructions with the patient.  The patient verbalized understanding.

## 2009-11-06 NOTE — ED Notes (Signed)
Pt reports left flank pain x 2 wks burning with urination

## 2009-12-16 NOTE — ED Notes (Signed)
Pt started having groin pain and nausea yesterday worsening this evening, back pain has been on and off for years.

## 2009-12-17 LAB — CBC WITH AUTOMATED DIFF
ABS. BASOPHILS: 0.1 10*3/uL (ref 0.0–0.1)
ABS. EOSINOPHILS: 0.1 10*3/uL (ref 0.0–0.5)
ABS. LYMPHOCYTES: 2.4 10*3/uL (ref 0.8–3.5)
ABS. MONOCYTES: 0.6 10*3/uL — ABNORMAL LOW (ref 0.8–3.5)
ABS. NEUTROPHILS: 4 10*3/uL (ref 1.5–8.0)
BASOPHILS: 1 % (ref 0–2)
EOSINOPHILS: 1 % (ref 0–5)
HCT: 42.8 % (ref 41–53)
HGB: 14.9 g/dL (ref 13.5–17.5)
LYMPHOCYTES: 34 % (ref 19–48)
MCH: 31.1 PG — ABNORMAL HIGH (ref 27–31)
MCHC: 34.8 g/dL (ref 31–37)
MCV: 89.2 FL (ref 80–100)
MONOCYTES: 8 % (ref 3–9)
MPV: 8.1 FL (ref 5.9–10.3)
NEUTROPHILS: 55 % (ref 40–74)
PLATELET: 203 10*3/uL (ref 130–400)
RBC: 4.8 M/uL (ref 4.7–6.1)
RDW: 13.9 % (ref 11.5–14.5)
WBC: 7.2 10*3/uL (ref 4.5–10.8)

## 2009-12-17 LAB — METABOLIC PANEL, BASIC
Anion gap: 8 mmol/L (ref 6–15)
BUN/Creatinine ratio: 15 (ref 7–25)
BUN: 15 MG/DL (ref 7–18)
CO2: 25 MMOL/L (ref 21–32)
Calcium: 8.4 MG/DL — ABNORMAL LOW (ref 8.5–10.1)
Chloride: 106 MMOL/L (ref 98–107)
Creatinine: 1 MG/DL (ref 0.6–1.3)
GFR est AA: >60 mL/min/{1.73_m2} (ref >60)
GFR est non-AA: >60 mL/min/{1.73_m2} (ref >60)
Glucose: 133 MG/DL — ABNORMAL HIGH (ref 70–110)
Potassium: 3.7 MMOL/L (ref 3.5–5.3)
Sodium: 139 MMOL/L (ref 136–145)

## 2009-12-17 LAB — URINALYSIS W/ RFLX MICROSCOPIC
Bilirubin: NEGATIVE
Blood: NEGATIVE
Glucose: NEGATIVE MG/DL
Ketone: NEGATIVE MG/DL
Leukocyte Esterase: NEGATIVE
Nitrites: NEGATIVE
Protein: NEGATIVE MG/DL
Specific gravity: 1.025 (ref 1.002–1.030)
Urobilinogen: 0.2 EU/DL (ref 0–1)
pH (UA): 5.5 (ref 4.5–8.0)

## 2009-12-17 MED ORDER — HYDROCODONE-ACETAMINOPHEN 5 MG-500 MG TAB
5-500 mg | ORAL_TABLET | ORAL | Status: DC | PRN
Start: 2009-12-17 — End: 2010-01-15

## 2009-12-17 MED ADMIN — morphine injection 4 mg: INTRAVENOUS | @ 05:00:00 | NDC 10019017639

## 2009-12-17 MED ADMIN — promethazine (PHENERGAN) with saline injection 12.5 mg: INTRAVENOUS | @ 05:00:00 | NDC 00641092821

## 2009-12-17 MED ADMIN — diphenhydrAMINE (BENADRYL) injection 50 mg: INTRAVENOUS | @ 05:00:00 | NDC 00641037625

## 2009-12-17 NOTE — Progress Notes (Signed)
CT ABD/PELVIS W/O COMPLETED 1:39 AM  Jesse Montoya L Jaeden [place]

## 2009-12-17 NOTE — ED Notes (Signed)
Discharge instructions given and verbalized understanding. Opportunity to voice questions and concerns provided.   Extensive instructions given regarding narcotics. Instructed not to drink alcohol, drive, or operate any machinery while taking prescribed medicine. Informed that the medication can cause drowsiness or altered level of consciousness, and that it can be habit forming if not taken as prescribed.

## 2009-12-17 NOTE — ED Notes (Signed)
Patient resting with eyes closed and resp reg and appears to be snoring. Updated family member at bedside that CT resulted and MD to review. States PA already to bedside for re-eval and d/c plan.

## 2009-12-18 NOTE — ED Provider Notes (Signed)
Patient is a 49 y.o. male presenting with groin pain and back pain. The history is provided by the patient and the spouse.   Groin Pain  This is a recurrent problem. The current episode started more than 2 days ago. The problem occurs constantly. The problem has been rapidly worsening. Pertinent negatives include no dysuria, no genital itching, no genital lesions, no genital rash, no penile discharge, no penile pain, no testicular mass, no swelling, no scrotal pain, no priapism and no inability to urinate. Pertinent negatives include no abdominal pain, no frequency, no constipation and no flank pain.     Back Pain   Pertinent negatives include no chest pain, no fever, no numbness, no headaches, no abdominal pain, no dysuria and no weakness.        Past Medical History   Diagnosis Date   ??? Hypertension    ??? Asthma      bronchitis   ??? Gastrointestinal disorder      acid reflux   ??? 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 Hx orthopaedic      right shoulder           Family History   Problem Relation Age of Onset   ??? Cancer Mother    ??? Lung Disease Mother    ??? Hypertension Mother    ??? Stroke Father    ??? Diabetes Father    ??? Hypertension Father           History   Social History   ??? Marital Status: Married     Spouse Name: N/A     Number of Children: N/A   ??? Years of Education: N/A   Occupational History   ??? Not on file.   Social History Main Topics   ??? Smoking status: Current Everyday Smoker -- 0.5 packs/day for 18 years   ??? Smokeless tobacco: Never Used   ??? Alcohol Use: Yes      seldom 12 pk per month   ??? Drug Use: No   ??? Sexually Active:    Other Topics Concern   ??? Not on file   Social History Narrative   ??? No narrative on file                    ALLERGIES: Tramadol, Percocet, Toradol, Robaxin and Other      Review of Systems   Constitutional: Negative for fever, chills, appetite change, fatigue and unexpected weight change.    HENT: Negative for ear pain, congestion, rhinorrhea, drooling, neck pain, neck stiffness, dental problem, sinus pressure and ear discharge.    Eyes: Negative for pain, discharge, redness, itching and visual disturbance.   Respiratory: Negative for cough, chest tightness, shortness of breath and wheezing.    Cardiovascular: Negative for chest pain, palpitations and leg swelling.   Gastrointestinal: Negative for abdominal pain, constipation, blood in stool, abdominal distention and anal bleeding.   Genitourinary: Negative for dysuria, urgency, frequency, hematuria, flank pain, difficulty urinating, penile pain and penile discharge.   Musculoskeletal: Positive for back pain. Negative for myalgias, joint swelling, arthralgias and gait problem.   Skin: Negative for color change and rash.   Neurological: Negative for dizziness, seizures, syncope, speech difficulty, weakness, numbness and headaches.   Hematological: Negative for adenopathy. Does not bruise/bleed easily.   Psychiatric/Behavioral: Negative for hallucinations, behavioral problems, confusion, sleep disturbance, self-injury and  agitation. The patient is not nervous/anxious.        Filed Vitals:    12/16/09 2221 12/17/09 0327   BP: 104/63 124/57   Pulse: 96 86   Temp: 97.9 ??F (36.6 ??C)    Resp: 18 16   Height: 6\' 1"  (1.854 m)    Weight: 220 lb (99.791 kg)    SpO2: 94% 96%              Physical Exam   Nursing note and vitals reviewed.  Constitutional: He is oriented to person, place, and time. Vital signs are normal. He appears well-developed and well-nourished.   HENT:   Head: Normocephalic and atraumatic.   Right Ear: External ear normal.   Left Ear: External ear normal.   Nose: Nose normal.   Mouth/Throat: Oropharynx is clear and moist.   Eyes: Conjunctivae, EOM and lids are normal. Pupils are equal, round, and reactive to light.    Neck: Normal range of motion and full passive range of motion without pain. Neck supple. No Brudzinski's sign and no Kernig's sign noted. No mass and no thyromegaly present.   Cardiovascular: Normal rate, regular rhythm, normal heart sounds, intact distal pulses and normal pulses.    Pulmonary/Chest: Effort normal and breath sounds normal. No accessory muscle usage. No respiratory distress. He has no decreased breath sounds. He has no wheezes. He has no rhonchi. He has no rales.   Abdominal: Soft. Bowel sounds are normal. There is no hepatosplenomegaly. No tenderness. He has no rigidity, no rebound, no guarding, no CVA tenderness, no pain at McBurney's point and no Murphy's sign. A hernia is present. Hernia confirmed positive in the right inguinal area and confirmed positive in the left inguinal area.   Genitourinary: Penis normal. No swelling in the scrotum or testes. Right testis shows no mass, no swelling and no tenderness. Right testis is descended. Left testis shows no mass, no swelling and no tenderness. Left testis is descended. Circumcised.   Musculoskeletal: Normal range of motion.        Right shoulder: He exhibits normal range of motion, no swelling, no effusion, normal pulse and normal strength.   Lymphadenopathy:     He has no cervical adenopathy.        Right: No inguinal adenopathy present.        Left: No inguinal adenopathy present.   Neurological: He is alert and oriented to person, place, and time. He has normal strength and normal reflexes.   Skin: Skin is warm, dry and intact.   Psychiatric: He has a normal mood and affect. His behavior is normal. Judgment and thought content normal.        MDM     Amount and/or Complexity of Data Reviewed:   Clinical lab tests:  Ordered and reviewed  Tests in the radiology section of CPT??:  Ordered and reviewed  Tests in the medicine section of the CPT??:  Ordered and reviewed  Discussion of test results with the performing providers:  Yes    Decide to obtain previous medical records or to obtain history from someone other than the patient:  Yes   Obtain history from someone other than the patient:  Yes   Review and summarize past medical records:  Yes   Discuss the patient with another provider:  Yes   Independant visualization of image, tracing, or specimen:  Yes      Procedures    Results for orders placed during the hospital encounter of 12/16/09  URINALYSIS W/ RFLX MICROSCOPIC   Component Value Range   ??? Color YELLOW     ??? Appearance CLEAR     ??? Specific gravity 1.025  1.002 - 1.030 ( )   ??? pH 5.5  4.5 - 8.0 ( )   ??? Protein NEGATIVE   (MG/DL)   ??? Glucose NEGATIVE   (MG/DL)   ??? Ketone NEGATIVE   (MG/DL)   ??? Bilirubin NEGATIVE      ??? Blood NEGATIVE      ??? Urobilinogen 0.2  0 - 1 (EU/DL)   ??? Nitrites NEGATIVE      ??? Leukocyte Esterase NEGATIVE      CBC WITH AUTOMATED DIFF   Component Value Range   ??? WBC 7.2  4.5 - 10.8 (K/uL)   ??? RBC 4.80  4.7 - 6.1 (M/uL)   ??? HGB 14.9  13.5 - 17.5 (g/dL)   ??? HCT 42.8  41 - 53 (%)   ??? MCV 89.2  80 - 100 (FL)   ??? MCH 31.1 (*) 27 - 31 (PG)   ??? MCHC 34.8  31 - 37 (g/dL)   ??? RDW 13.9  11.5 - 14.5 (%)   ??? PLATELET 203  130 - 400 (K/uL)   ??? MPV 8.1  5.9 - 10.3 (FL)   ??? NEUTROPHILS 55  40 - 74 (%)   ??? LYMPHOCYTES 34  19 - 48 (%)   ??? MONOCYTES 8  3 - 9 (%)   ??? EOSINOPHILS 1  0 - 5 (%)   ??? BASOPHILS 1  0 - 2 (%)   ??? ABSOLUTE NEUTS 4.0  1.5 - 8.0 (K/UL)   ??? ABSOLUTE LYMPHS 2.4  0.8 - 3.5 (K/UL)   ??? ABSOLUTE MONOS 0.6 (*) 0.8 - 3.5 (K/UL)   ??? ABSOLUTE EOSINS 0.1  0.0 - 0.5 (K/UL)   ??? ABSOLUTE BASOS 0.1  0.0 - 0.1 (K/UL)   ??? DF AUTOMATED     METABOLIC PANEL, BASIC   Component Value Range   ??? Sodium 139  136 - 145 (MMOL/L)   ??? Potassium 3.7  3.5 - 5.3 (MMOL/L)   ??? Chloride 106  98 - 107 (MMOL/L)   ??? CO2 25  21 - 32 (MMOL/L)   ??? Anion gap 8  6 - 15 (mmol/L)   ??? Glucose 133 (*) 70 - 110 (MG/DL)   ??? BUN 15  7 - 18 (MG/DL)   ??? Creatinine 1.0  0.6 - 1.3 (MG/DL)   ??? BUN/Creatinine ratio 15  7 - 25 ( )    ??? GFR est AA >60  >60 (ml/min/1.2m2)   ??? GFR est non-AA >60  >60 (ml/min/1.55m2)   ??? Calcium 8.4 (*) 8.5 - 10.1 (MG/DL)         Medications   fenofibrate (TRICOR) 54 mg tablet (not administered)   hydrocodone-acetaminophen (VICODIN) 5-500 mg per tablet (not administered)   diphenhydrAMINE (BENADRYL) injection 50 mg (50 mg IntraVENous Given 12/17/09 0109)   promethazine (PHENERGAN) with saline injection 12.5 mg (12.5 mg IntraVENous Given 12/17/09 0111)   morphine injection 4 mg (4 mg IntraVENous Given 12/17/09 0113)   hydrocodone-acetaminophen (NORCO) 5-325 mg per tablet 1 Tab (1 Tab Oral Dispensed at Discharge 12/17/09 1610)         Diagnoses that have been ruled out:   Diagnoses that are still under consideration:   Final diagnoses:   Abdominal pain, generalized   Inguinal hernia         Patient  discussed with Dr. Reita May .  He reviewd all Labs and Imaging.

## 2009-12-18 NOTE — ED Provider Notes (Signed)
I personally saw and examined the patient.  I have reviewed and agree with the MLP's findings, including all diagnostic interpretations, and plans as written.    Cherokee Boccio, MD

## 2009-12-26 ENCOUNTER — Encounter

## 2010-01-01 MED ORDER — DIATRIZOATE MEGLUMINE 18 % URETHRAL SOLUTION
18 % | Freq: Once | URETHRAL | Status: AC
Start: 2010-01-01 — End: 2010-01-01
  Administered 2010-01-01: 19:00:00 via URETHRAL

## 2010-01-01 NOTE — Progress Notes (Signed)
VCUG COMPLETE AT 1:38 PM WITH NO COMPLICATIONS.  CYSTOGRAFIN USED.    1.0MIN FLUORO TIME.    Jesse Montoya R Jesse Montoya

## 2010-01-15 LAB — CBC WITH AUTOMATED DIFF
ABS. BASOPHILS: 0 10*3/uL (ref 0.0–0.1)
ABS. EOSINOPHILS: 0.1 10*3/uL (ref 0.0–0.5)
ABS. LYMPHOCYTES: 2.4 10*3/uL (ref 0.8–3.5)
ABS. MONOCYTES: 0.5 10*3/uL — ABNORMAL LOW (ref 0.8–3.5)
ABS. NEUTROPHILS: 4.1 10*3/uL (ref 1.5–8.0)
BASOPHILS: 0 % (ref 0–2)
EOSINOPHILS: 1 % (ref 0–5)
HCT: 46.8 % (ref 41–53)
HGB: 16.2 g/dL (ref 13.5–17.5)
LYMPHOCYTES: 33 % (ref 19–48)
MCH: 29.9 PG (ref 27–31)
MCHC: 34.6 g/dL (ref 31–37)
MCV: 86.3 FL (ref 78–98)
MONOCYTES: 7 % (ref 3–9)
MPV: 10.6 FL — ABNORMAL HIGH (ref 5.9–10.3)
NEUTROPHILS: 59 % (ref 40–74)
PLATELET: 206 10*3/uL (ref 130–400)
RBC: 5.42 M/uL (ref 4.7–6.1)
RDW: 12.7 % (ref 11.5–14.5)
WBC: 7.1 10*3/uL (ref 4.5–10.8)

## 2010-01-15 LAB — METABOLIC PANEL, BASIC
Anion gap: 10 mmol/L (ref 6–15)
BUN/Creatinine ratio: 14 (ref 7–25)
BUN: 14 MG/DL (ref 7–18)
CO2: 23 MMOL/L (ref 21–32)
Calcium: 8.9 MG/DL (ref 8.5–10.1)
Chloride: 106 MMOL/L (ref 98–107)
Creatinine: 1 MG/DL (ref 0.6–1.3)
GFR est AA: >60 mL/min/{1.73_m2} (ref >60)
GFR est non-AA: >60 mL/min/{1.73_m2} (ref >60)
Glucose: 105 MG/DL (ref 70–110)
Potassium: 3.7 MMOL/L (ref 3.5–5.3)
Sodium: 139 MMOL/L (ref 136–145)

## 2010-01-15 MED ORDER — MORPHINE 5 MG/ML IJ SOLN
5 mg/mL | Freq: Once | INTRAMUSCULAR | Status: AC
Start: 2010-01-15 — End: 2010-01-15
  Administered 2010-01-15: 23:00:00 via INTRAVENOUS

## 2010-01-15 MED ORDER — PROMETHAZINE 25 MG/ML INJECTION
25 mg/mL | Freq: Once | INTRAMUSCULAR | Status: AC | PRN
Start: 2010-01-15 — End: 2010-01-15
  Administered 2010-01-15: 23:00:00 via INTRAVENOUS

## 2010-01-15 MED FILL — PROMETHAZINE 25 MG/ML INJECTION: 25 mg/mL | INTRAMUSCULAR | Qty: 1

## 2010-01-15 MED FILL — MORPHINE 5 MG/ML IJ SOLN: 5 mg/mL | INTRAMUSCULAR | Qty: 1

## 2010-01-15 NOTE — ED Notes (Signed)
Jesse Montoya contacted thru his PA.  States Pat fishes for pain meds.  As long as no incarcerated hernia pat can go home and be seen in office.

## 2010-01-15 NOTE — Progress Notes (Signed)
CT ABD/PELVIS W/O COMPLETED 6:25 PM  Marvalene Barrett L Roxanna Mcever

## 2010-01-15 NOTE — ED Notes (Signed)
States pain eased up a little but has returned again after CT

## 2010-01-15 NOTE — ED Provider Notes (Signed)
Patient is a 49 y.o. male presenting with abdominal pain. The history is provided by the patient.   Abdominal Pain   This is a recurrent (Pt recently dx with hernias in anterior abd wall as well as R inguinal region and suddenly began to have worsening pain over the last 2 days that has been associated with nausea.  NO fevers or dysuria.  NO testicular pain as the pain seems to be RLQ.) problem. The problem occurs constantly. The problem has not changed since onset. The pain is located in the RLQ. The pain is moderate. Associated symptoms include nausea. Pertinent negatives include no fever, no belching, no diarrhea, no flatus, no hematochezia, no melena, no vomiting, no constipation, no dysuria, no frequency, no hematuria, no headaches, no arthralgias, no myalgias, no trauma, no chest pain, no testicular pain and no back pain. Nothing worsens the pain. The pain is relieved by nothing.        Past Medical History   Diagnosis Date   ??? Hypertension    ??? Asthma      bronchitis   ??? Gastrointestinal disorder      acid reflux   ??? 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 Hx orthopaedic      right shoulder           Family History   Problem Relation Age of Onset   ??? Cancer Mother    ??? Lung Disease Mother    ??? Hypertension Mother    ??? Stroke Father    ??? Diabetes Father    ??? Hypertension Father           History   Social History   ??? Marital Status: Single     Spouse Name: N/A     Number of Children: N/A   ??? Years of Education: N/A   Occupational History   ??? Not on file.   Social History Main Topics   ??? Smoking status: Current Everyday Smoker -- 0.5 packs/day for 18 years   ??? Smokeless tobacco: Never Used   ??? Alcohol Use: No      seldom 12 pk per month   ??? Drug Use: No   ??? Sexually Active: Yes -- Male partner(s)     Birth Control/ Protection: None   Other Topics Concern   ??? Not on file   Social History Narrative   ??? No narrative on file                     ALLERGIES: Tramadol, Percocet, Toradol, Robaxin, Other, Tramadol, Toradol, Robaxin and Percocet      Review of Systems   Constitutional: Negative for fever and chills.   HENT: Negative for ear pain, congestion and postnasal drip.    Eyes: Negative for pain.   Respiratory: Negative for cough and shortness of breath.    Cardiovascular: Negative for chest pain and palpitations.   Gastrointestinal: Positive for nausea and abdominal pain. Negative for vomiting, diarrhea, constipation, melena, hematochezia and flatus.   Genitourinary: Negative for dysuria, frequency, hematuria and testicular pain.   Musculoskeletal: Negative for myalgias, back pain and arthralgias.   Skin: Negative for rash.   Neurological: Negative for weakness and headaches.       Filed Vitals:    01/15/10 1628 01/15/10 1729   BP: 119/79    Pulse: 94    Temp:  98.2 ??F (36.8 ??C)    Resp: 18    Height: 6\' 1"  (1.854 m)    Weight: 224 lb (101.606 kg)    SpO2: 96% 97%              Physical Exam   Nursing note and vitals reviewed.  Constitutional: He is oriented to person, place, and time. He appears well-developed. He appears distressed.   HENT:   Head: Normocephalic.   Right Ear: External ear normal.   Left Ear: External ear normal.   Nose: Nose normal.   Mouth/Throat: Oropharynx is clear and moist.   Eyes: EOM are normal. Pupils are equal, round, and reactive to light.   Neck: Normal range of motion. Neck supple.   Cardiovascular: Normal rate, regular rhythm and normal heart sounds.    Pulmonary/Chest: Effort normal. He has no wheezes. He has no rales.   Abdominal: Soft. Bowel sounds are normal. Tenderness is present.        MOderate tenderness to the R lower abd wall without guarding or rebound tenderness.  BSA.  No palpable inguinal hernia within the scrotum or canal.   Musculoskeletal: Normal range of motion. He exhibits no edema.   Lymphadenopathy:     He has no cervical adenopathy.   Neurological: He is alert and oriented to person, place, and time.    Skin: Skin is warm and dry. No rash noted.   Psychiatric: He has a normal mood and affect.        MDM     Amount and/or Complexity of Data Reviewed:   Clinical lab tests:  Ordered and reviewed  Tests in the radiology section of CPT??:  Ordered and reviewed      Procedures

## 2010-01-15 NOTE — ED Notes (Signed)
Dc to home.  Dc instructions given and reviewed.  Wife at bedside to drive home

## 2010-01-15 NOTE — ED Notes (Signed)
Pt states abd pain x 1 day thinks he has hernia toward groin

## 2010-01-16 LAB — URINALYSIS W/MICROSCOPIC
Bilirubin: NEGATIVE
Blood: NEGATIVE
Glucose: NEGATIVE MG/DL
Ketone: NEGATIVE MG/DL
Leukocyte Esterase: NEGATIVE
Nitrites: NEGATIVE
Protein: NEGATIVE MG/DL
Specific gravity: 1.015 (ref 1.002–1.030)
Urobilinogen: 0.2 EU/DL (ref 0–1)
pH (UA): 5.5 (ref 4.5–8.0)

## 2010-01-16 LAB — DRUG SCREEN, URINE
AMPHETAMINES: NEGATIVE
BARBITURATES: NEGATIVE
BENZODIAZEPINES: NEGATIVE
COCAINE: NEGATIVE
METHADONE: NEGATIVE
OPIATES: POSITIVE
PCP(PHENCYCLIDINE): NEGATIVE
THC (TH-CANNABINOL): NEGATIVE
TRICYCLICS: NEGATIVE

## 2010-02-22 NOTE — Progress Notes (Signed)
CXR COMPLETE AT 9:14 PM WITH NO COMPLICATIONS.    Jesse Montoya

## 2010-02-22 NOTE — ED Notes (Signed)
Pt walking in his house, got dizzy, was a little disoriented, sat down & c/o of left facial & left arm numbness.  Also c/o left eye burning.  Wife states he had a "glazed, where am I" kind of look in his face for about 15 minutes.

## 2010-02-22 NOTE — ED Notes (Signed)
Patient requesting something for pain and nausea.  MD notified.Marland Kitchen

## 2010-02-22 NOTE — ED Notes (Signed)
Jesse Montoya in lab notified of added blood work

## 2010-02-22 NOTE — ED Notes (Signed)
Patient vomiting after zofran admin.  Dr. Vickii Penna notified.Marland Kitchen

## 2010-02-22 NOTE — Progress Notes (Signed)
CT HEAD COMPLETED 9:05 PM    Jesse Montoya [name]

## 2010-02-22 NOTE — ED Notes (Signed)
Dr. Hammack at bedside.

## 2010-02-22 NOTE — ED Notes (Signed)
Patient returned to ER 16 from radiology

## 2010-02-22 NOTE — ED Provider Notes (Signed)
HPI Comments: Pt states that for the past 2-3 days the top of his head has felt numb.  He states that he has had episodes of confusion, numbness and left sided weakness.  He states that he BP has been mildly elevated for the past two days.  He also states the he has been having a sharp pain and burning sensation in the left eye.    Patient is a 49 y.o. male presenting with altered mental status. The history is provided by the patient and the spouse. No language interpreter was used.   Altered mental status   This is a recurrent problem. The current episode started 2 days ago. The problem has not changed since onset. Associated symptoms include confusion, weakness, tingling and numbness. Pertinent negatives include no somnolence, no seizures, no unresponsiveness, no agitation, no delusions, no hallucinations, no self-injury and no violence. Mental status baseline is normal.  His past medical history is significant for TIA, hypertension and head trauma. His past medical history does not include diabetes, seizures, liver disease, CVA, AIDS, COPD, depression, dementia, psychotropic medication treatment or heart disease.        Past Medical History   Diagnosis Date   ??? Hypertension    ??? Asthma      bronchitis   ??? Gastrointestinal disorder      acid reflux   ??? 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 Hx orthopaedic      right shoulder           Family History   Problem Relation Age of Onset   ??? Cancer Mother    ??? Lung Disease Mother    ??? Hypertension Mother    ??? Stroke Father    ??? Diabetes Father    ??? Hypertension Father           History   Social History   ??? Marital Status: Single     Spouse Name: N/A     Number of Children: N/A   ??? Years of Education: N/A   Occupational History   ??? Not on file.   Social History Main Topics   ??? Smoking status: Current Everyday Smoker -- 0.5 packs/day for 18 years    ??? Smokeless tobacco: Never Used   ??? Alcohol Use: No      seldom 12 pk per month   ??? Drug Use: No   ??? Sexually Active: Yes -- Male partner(s)     Birth Control/ Protection: None   Other Topics Concern   ??? Not on file   Social History Narrative   ??? No narrative on file                    ALLERGIES: Tramadol, Percocet, Toradol, Robaxin, Other, Tramadol, Toradol, Robaxin and Percocet      Review of Systems   Constitutional: Negative for fever, chills, diaphoresis and activity change.   HENT: Negative for nosebleeds, congestion, sore throat, rhinorrhea, trouble swallowing, neck pain, neck stiffness and sinus pressure.    Eyes: Positive for pain. Negative for photophobia, redness and visual disturbance.   Respiratory: Negative for cough, chest tightness and shortness of breath.    Cardiovascular: Negative for chest pain and palpitations.   Gastrointestinal: Negative for nausea, vomiting, abdominal  pain, diarrhea and abdominal distention.   Genitourinary: Negative for hematuria and flank pain.   Musculoskeletal: Negative for myalgias, back pain, joint swelling, arthralgias and gait problem.   Skin: Negative for color change, pallor, rash and wound.   Neurological: Positive for tingling, weakness and numbness. Negative for dizziness, tremors, seizures, syncope, light-headedness and headaches.   Psychiatric/Behavioral: Positive for confusion, decreased concentration and altered mental status. Negative for suicidal ideas, hallucinations, behavioral problems, self-injury and agitation. The patient is not nervous/anxious.        Filed Vitals:    02/22/10 1941   BP: 156/85   Pulse: 86   Temp: 97.6 ??F (36.4 ??C)   Resp: 22   Height: 6' (1.829 m)   Weight: 224 lb (101.606 kg)   SpO2: 99%              Physical Exam   Nursing note and vitals reviewed.   Constitutional: He is oriented to person, place, and time. He appears well-developed and well-nourished. He is cooperative.  Non-toxic appearance. He does not appear ill. No distress.   HENT:   Head: Normocephalic and atraumatic. No trismus in the jaw.   Mouth/Throat: Uvula is midline. Mucous membranes are not pale, not dry and not cyanotic. No oropharyngeal exudate.   Eyes: Conjunctivae and EOM are normal. Pupils are equal, round, and reactive to light. Right conjunctiva is not injected. Left conjunctiva is not injected. No scleral icterus.   Neck: Normal range of motion. Neck supple. Carotid bruit is not present. No tracheal deviation, no edema and no erythema present. No thyromegaly present.   Cardiovascular: Normal rate, regular rhythm, S1 normal, S2 normal, normal heart sounds and intact distal pulses.  PMI is not displaced.  Exam reveals no gallop, no friction rub and no decreased pulses.    No murmur heard.  Pulmonary/Chest: Effort normal and breath sounds normal. No stridor. No respiratory distress. He has no wheezes. He has no rales. He exhibits no tenderness.   Abdominal: Soft. Bowel sounds are normal. He exhibits no shifting dullness, no distension, no fluid wave, no abdominal bruit and no pulsatile midline mass. No tenderness. He has no rebound, no guarding and no CVA tenderness.   Musculoskeletal: Normal range of motion. He exhibits no edema and no tenderness.   Lymphadenopathy:     He has no cervical adenopathy.   Neurological: He is alert and oriented to person, place, and time. He has normal strength. He displays no atrophy. No cranial nerve deficit or sensory deficit. He exhibits normal muscle tone.   Skin: Skin is warm and dry. No bruising, no lesion and no rash noted. He is not diaphoretic. No cyanosis or erythema. No pallor. Nails show no clubbing.    Psychiatric: He has a normal mood and affect. His speech is normal and behavior is normal. Judgment and thought content normal. His mood appears not anxious. His affect is not blunt. His speech is not rapid and/or pressured and not slurred. He is not agitated, not aggressive and not withdrawn. Cognition and memory are normal. Cognition and memory are not impaired. He does not express inappropriate judgment. He does not exhibit a depressed mood. He expresses no suicidal ideation. He expresses no suicidal plans and no homicidal plans.        MDM     Amount and/or Complexity of Data Reviewed:   Clinical lab tests:  Ordered and reviewed  Tests in the radiology section of CPT??:  Ordered and reviewed  Tests in the  medicine section of the CPT??:  Ordered and reviewed  Discussion of test results with the performing providers:  Yes   Decide to obtain previous medical records or to obtain history from someone other than the patient:  Yes   Obtain history from someone other than the patient:  Yes   Review and summarize past medical records:  Yes   Discuss the patient with another provider:  No   Independant visualization of image, tracing, or specimen:  Yes      Procedures

## 2010-02-23 LAB — METABOLIC PANEL, COMPREHENSIVE
A-G Ratio: 1.4 (ref 1.2–2.2)
ALT (SGPT): 46 U/L (ref 12–78)
AST (SGOT): 21 U/L (ref 15–37)
Albumin: 4 g/dL (ref 3.4–5.0)
Alk. phosphatase: 61 U/L (ref 50–136)
Anion gap: 5 mmol/L — ABNORMAL LOW (ref 6–15)
BUN/Creatinine ratio: 11 (ref 7–25)
BUN: 10 MG/DL (ref 7–18)
Bilirubin, total: 0.4 MG/DL (ref <0.8)
CO2: 26 MMOL/L (ref 21–32)
Calcium: 8.4 MG/DL — ABNORMAL LOW (ref 9.0–10.7)
Chloride: 106 MMOL/L (ref 98–107)
Creatinine: 0.9 MG/DL (ref 0.6–1.3)
GFR est AA: >60 mL/min/{1.73_m2} (ref >60)
GFR est non-AA: >60 mL/min/{1.73_m2} (ref >60)
Globulin: 2.9 g/dL (ref 2.4–3.5)
Glucose: 120 MG/DL — ABNORMAL HIGH (ref 70–110)
Potassium: 3.5 MMOL/L (ref 3.5–5.3)
Protein, total: 6.9 g/dL (ref 6.4–8.2)
Sodium: 137 MMOL/L (ref 136–145)

## 2010-02-23 LAB — DRUG SCREEN, URINE
AMPHETAMINES: NEGATIVE
BARBITURATES: NEGATIVE
BENZODIAZEPINES: NEGATIVE
COCAINE: NEGATIVE
METHADONE: NEGATIVE
OPIATES: POSITIVE
PCP(PHENCYCLIDINE): NEGATIVE
THC (TH-CANNABINOL): NEGATIVE
TRICYCLICS: NEGATIVE

## 2010-02-23 LAB — THYROID PANEL W/TSH
Free thyroxine index: 2.7 (ref 1.4–5.2)
T3 Uptake: 34 % (ref 31–39)
T4, Total: 8 ug/dL (ref 4.7–13.3)
TSH: 1.16 u[IU]/mL (ref 0.35–3.74)

## 2010-02-23 LAB — CBC WITH AUTOMATED DIFF
ABS. BASOPHILS: 0 10*3/uL (ref 0.0–0.1)
ABS. EOSINOPHILS: 0.1 10*3/uL (ref 0.0–0.5)
ABS. LYMPHOCYTES: 2 10*3/uL (ref 0.8–3.5)
ABS. MONOCYTES: 0.5 10*3/uL — ABNORMAL LOW (ref 0.8–3.5)
ABS. NEUTROPHILS: 2.9 10*3/uL (ref 1.5–8.0)
BASOPHILS: 0 % (ref 0–2)
EOSINOPHILS: 1 % (ref 0–5)
HCT: 44.2 % (ref 41–53)
HGB: 15.6 g/dL (ref 13.5–17.5)
LYMPHOCYTES: 36 % (ref 19–48)
MCH: 30.3 PG (ref 27–31)
MCHC: 35.3 g/dL (ref 31–37)
MCV: 85.8 FL (ref 78–98)
MONOCYTES: 10 % — ABNORMAL HIGH (ref 3–9)
MPV: 10.4 FL — ABNORMAL HIGH (ref 5.9–10.3)
NEUTROPHILS: 53 % (ref 40–74)
PLATELET: 151 10*3/uL (ref 130–400)
RBC: 5.15 M/uL (ref 4.7–6.1)
RDW: 12.4 % (ref 11.5–14.5)
WBC: 5.6 10*3/uL (ref 4.5–10.8)

## 2010-02-23 LAB — URINALYSIS W/MICROSCOPIC
Bilirubin: NEGATIVE
Blood: NEGATIVE
Glucose: NEGATIVE MG/DL
Ketone: NEGATIVE MG/DL
Leukocyte Esterase: NEGATIVE
Nitrites: NEGATIVE
Protein: NEGATIVE MG/DL
Specific gravity: 1.02 (ref 1.002–1.030)
Urobilinogen: 0.2 EU/DL (ref 0–1)
pH (UA): 7 (ref 4.5–8.0)

## 2010-02-23 LAB — BNP: BNP: 7 pg/mL (ref <100)

## 2010-02-23 LAB — CK-MB,QUANT.
CK - MB: 4.9 NG/ML (ref 0.0–5.0)
CK-MB Index: 3.1 (ref 0–4)

## 2010-02-23 LAB — GLUCOSE, POC: Glucose (POC): 138 mg/dL — ABNORMAL HIGH (ref 70–110)

## 2010-02-23 LAB — AMYLASE: Amylase: 45 U/L (ref 25–115)

## 2010-02-23 LAB — EKG, 12 LEAD, INITIAL
Atrial Rate: 85 {beats}/min
Calculated P Axis: 52 degrees
Calculated R Axis: -18 degrees
Calculated T Axis: 55 degrees
Diagnosis: NORMAL
P-R Interval: 162 ms
Q-T Interval: 366 ms
QRS Duration: 104 ms
QTC Calculation (Bezet): 435 ms
Ventricular Rate: 85 {beats}/min

## 2010-02-23 LAB — PROTHROMBIN TIME + INR
INR: 1 (ref 0.9–1.1)
Prothrombin time: 13 s (ref 11.3–14.6)

## 2010-02-23 LAB — LIPASE: Lipase: 130 U/L (ref 73–393)

## 2010-02-23 LAB — AMMONIA: Ammonia, plasma: 55 umol/L — ABNORMAL HIGH (ref <32)

## 2010-02-23 LAB — CARDIAC PROFILE
CK: 160 U/L (ref 39–308)
Troponin-I, Qt.: <0.02 ng/mL (ref 0.00–0.05)

## 2010-02-23 LAB — PTT: aPTT: 35.4 s (ref 22.6–40.3)

## 2010-02-23 MED ORDER — PROMETHAZINE 25 MG/ML INJECTION
25 mg/mL | INTRAMUSCULAR | Status: AC
Start: 2010-02-23 — End: 2010-02-22
  Administered 2010-02-23: 05:00:00 via INTRAVENOUS

## 2010-02-23 MED ORDER — ONDANSETRON (PF) 4 MG/2 ML INJECTION
4 mg/2 mL | INTRAMUSCULAR | Status: AC
Start: 2010-02-23 — End: 2010-02-22
  Administered 2010-02-23: 04:00:00 via INTRAVENOUS

## 2010-02-23 MED ORDER — MORPHINE 5 MG/ML IJ SOLN
5 mg/mL | INTRAMUSCULAR | Status: AC
Start: 2010-02-23 — End: 2010-02-22
  Administered 2010-02-23: 04:00:00 via INTRAVENOUS

## 2010-02-23 MED ORDER — ONDANSETRON (PF) 4 MG/2 ML INJECTION
4 mg/2 mL | INTRAMUSCULAR | Status: AC
Start: 2010-02-23 — End: 2010-02-22
  Administered 2010-02-23: 01:00:00 via INTRAVENOUS

## 2010-02-23 MED FILL — PROMETHAZINE 25 MG/ML INJECTION: 25 mg/mL | INTRAMUSCULAR | Qty: 1

## 2010-02-23 MED FILL — ONDANSETRON (PF) 4 MG/2 ML INJECTION: 4 mg/2 mL | INTRAMUSCULAR | Qty: 2

## 2010-02-23 MED FILL — MORPHINE 5 MG/ML IJ SOLN: 5 mg/mL | INTRAMUSCULAR | Qty: 1

## 2010-02-23 NOTE — ED Notes (Signed)
I have reviewed discharge instructions with the patient.  The patient verbalized understanding.  Armband removed.

## 2010-02-23 NOTE — ED Notes (Signed)
Results for orders placed during the hospital encounter of 02/22/10   GLUCOSE, POC   Component Value Range   ??? POC GLUCOSE 138 (*) 70 - 110 (mg/dL)   URINALYSIS W/MICROSCOPIC   Component Value Range   ??? Color YELLOW     ??? Appearance CLEAR     ??? Specific gravity 1.020  1.002 - 1.030 ( )   ??? pH 7.0  4.5 - 8.0 ( )   ??? Protein NEGATIVE   (MG/DL)   ??? Glucose NEGATIVE   (MG/DL)   ??? Ketone NEGATIVE   (MG/DL)   ??? Bilirubin NEGATIVE      ??? Blood NEGATIVE      ??? Urobilinogen 0.2  0 - 1 (EU/DL)   ??? Nitrites NEGATIVE      ??? Leukocyte Esterase NEGATIVE      ??? WBC NONE  (/HPF)   ??? RBC NONE  (/HPF)   ??? Bacteria NONE  (/HPF)   CBC WITH AUTOMATED DIFF   Component Value Range   ??? WBC 5.6  4.5 - 10.8 (K/uL)   ??? RBC 5.15  4.7 - 6.1 (M/uL)   ??? HGB 15.6  13.5 - 17.5 (g/dL)   ??? HCT 44.2  41 - 53 (%)   ??? MCV 85.8  78 - 98 (FL)   ??? MCH 30.3  27 - 31 (PG)   ??? MCHC 35.3  31 - 37 (g/dL)   ??? RDW 12.4  11.5 - 14.5 (%)   ??? PLATELET 151  130 - 400 (K/uL)   ??? MPV 10.4 (*) 5.9 - 10.3 (FL)   ??? NEUTROPHILS 53  40 - 74 (%)   ??? LYMPHOCYTES 36  19 - 48 (%)   ??? MONOCYTES 10 (*) 3 - 9 (%)   ??? EOSINOPHILS 1  0 - 5 (%)   ??? BASOPHILS 0  0 - 2 (%)   ??? ABSOLUTE NEUTS 2.9  1.5 - 8.0 (K/UL)   ??? ABSOLUTE LYMPHS 2.0  0.8 - 3.5 (K/UL)   ??? ABSOLUTE MONOS 0.5 (*) 0.8 - 3.5 (K/UL)   ??? ABSOLUTE EOSINS 0.1  0.0 - 0.5 (K/UL)   ??? ABSOLUTE BASOS 0.0  0.0 - 0.1 (K/UL)   ??? DF AUTOMATED     METABOLIC PANEL, COMPREHENSIVE   Component Value Range   ??? Sodium 137  136 - 145 (MMOL/L)   ??? Potassium 3.5  3.5 - 5.3 (MMOL/L)   ??? Chloride 106  98 - 107 (MMOL/L)   ??? CO2 26  21 - 32 (MMOL/L)   ??? Anion gap 5 (*) 6 - 15 (mmol/L)   ??? Glucose 120 (*) 70 - 110 (MG/DL)   ??? BUN 10  7 - 18 (MG/DL)   ??? Creatinine 0.9  0.6 - 1.3 (MG/DL)   ??? BUN/Creatinine ratio 11  7 - 25 ( )   ??? GFR est AA >60  >60 (ml/min/1.60m2)   ??? GFR est non-AA >60  >60 (ml/min/1.62m2)   ??? Calcium 8.4 (*) 9.0 - 10.7 (MG/DL)   ??? Bilirubin, total 0.4  <0.8 (MG/DL)   ??? ALT 46  12 - 78 (U/L)   ??? AST 21  15 - 37 (U/L)    ??? Alk. phosphatase 61  50 - 136 (U/L)   ??? Protein, total 6.9  6.4 - 8.2 (g/dL)   ??? Albumin 4.0  3.4 - 5.0 (g/dL)   ??? Globulin 2.9  2.4 - 3.5 (g/dL)   ??? A-G Ratio 1.4  1.2 -  2.2 ( )   AMYLASE   Component Value Range   ??? Amylase 45  25 - 115 (U/L)   LIPASE   Component Value Range   ??? Lipase 130  73 - 393 (U/L)   PROTHROMBIN TIME   Component Value Range   ??? INR 1.0  0.9 - 1.1 ( )   ??? Prothrombin Time-PT 13.0  11.3 - 14.6 (sec)   PTT   Component Value Range   ??? aPTT 35.4  22.6 - 40.3 (SEC)   DRUG SCREEN, URINE   Component Value Range   ??? METHADONE NEGATIVE      ??? PCP(PHENCYCLIDINE) NEGATIVE      ??? BENZODIAZEPINE NEGATIVE      ??? COCAINE NEGATIVE      ??? AMPHETAMINE NEGATIVE      ??? OPIATES POSITIVE     ??? BARBITURATES NEGATIVE      ??? TRICYCLICS NEGATIVE      ??? THC (TH-CANNABINOL) NEGATIVE      THYROID PANEL W/TSH   Component Value Range   ??? TSH, 3rd generation 1.16  0.35 - 3.74 (UIU/ML)   ??? T4 8.0  4.7 - 13.3 (ug/dL)   ??? T3 Uptake 34  31 - 39 (%)   ??? Free thyroxine index 2.7  1.4 - 5.2 ( )   AMMONIA   Component Value Range   ??? Ammonia 55 (*) <32 (UMOL/L)   CARDIAC PROFILE   Component Value Range   ??? CK 160  39 - 308 (U/L)   ??? Troponin-I, Qt. <0.02  0.00 - 0.05 (ng/mL)   BNP   Component Value Range   ??? B-NP 7  <100 (pg/mL)   CK-MB,QUANT.   Component Value Range   ??? CK - MB 4.9  0.0 - 5.0 (NG/ML)   ??? CK-MB Index 3.1  0 - 4 ( )       CT = no acute process  CXR = No infiltrates, no consolidations

## 2010-02-27 LAB — CULTURE, BLOOD,  2ND DRAW: Culture result:: NO GROWTH

## 2010-02-27 LAB — CULTURE, BLOOD: Culture result:: NO GROWTH

## 2010-04-16 ENCOUNTER — Other Ambulatory Visit

## 2010-04-16 ENCOUNTER — Encounter

## 2010-04-16 LAB — AMB POC CREATININE ASSAY, OTHER SOURCE
Creatinine, other source: 300
Source: NORMAL

## 2010-04-16 LAB — AMB POC URINALYSIS DIP STICK AUTO W/O MICRO
Glucose (UA POC): NEGATIVE
Ketones (UA POC): NEGATIVE
Leukocyte esterase (UA POC): NEGATIVE
Nitrites (UA POC): NEGATIVE
Protein (UA POC): NEGATIVE mg/dL
Specific gravity (UA POC): 1.025 (ref 1.001–1.035)
pH (UA POC): 6 (ref 4.6–8.0)

## 2010-04-16 MED ORDER — PHENAZOPYRIDINE 100 MG TAB
100 mg | ORAL_TABLET | Freq: Three times a day (TID) | ORAL | Status: AC
Start: 2010-04-16 — End: 2010-04-19

## 2010-04-16 NOTE — Progress Notes (Signed)
Male Progress Note      Patient: Jesse Montoya               Sex: male             MRN: [id number]      Date of Birth:  July 31, 1960      Age:  50 y.o.               Subjective:     Jesse Montoya is a 50 y.o. male who is being seen for follow-up for bladder diverticulum.  The patient is still having pain since last time.  He is drinking close to 64 oz of water daily and he has decreased his caffeine, soda, teas, etc.  The pain is worse when he holds his urine, he has occasional dysuria, and then pain and nausea after he voids.  This happens almost every time he urinates.  He also has urgency with small volume voids occasionally, but there is always the pain.  The pain has increased since his last visit.  The patient is still smoking, but has cut down to a quarter of a pack a day.  The patient is interested in having a bladder diverticuletomy and hernia repair at this time.  He would like it done as soon as possible.  He states that he has also had swelling in his groin and his lower abdomen, this has also worsened since his last visit.    The patient has also been having "kidney pain."  The pain was in his left kidney, but has moved to his right and has also been in both kidneys at the same time.  The pain is sharp and burning and the pain will cause his bladder to swell.  This pain in spontaneous and isn't precipitated by any certain activities.  Stretching his back will help the pain.  He also states that his back is very sore to touch when this happens.    No new problems.   No new surgeries.  No fever or chills.  No CP or SOB.  He does have nausea after his voids.  No diarrhea.  No gross hematuria.    Past Medical History   Diagnosis Date   ??? Hypertension    ??? Asthma      bronchitis   ??? Gastrointestinal disorder      acid reflux   ??? 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 Arthritis    ??? Hyperlipidemia    ??? Chronic pain         Past Surgical History   Procedure Date   ??? Hx orthopaedic      right shoulder   ??? Cystoscopy 12/2009       Family History   Problem Relation Age of Onset   ??? Cancer Mother    ??? Lung Disease Mother    ??? Hypertension Mother    ??? Stroke Father    ??? Diabetes Father    ??? Hypertension Father    ??? Other Other      Family hx of brain tumors   ??? Heart Disease Other    ??? Diabetes Other        History     Social History   ??? Marital Status: Single     Spouse Name: N/A     Number of  Children: N/A   ??? Years of Education: N/A     Social History Main Topics   ??? Smoking status: Current Everyday Smoker -- 0.5 packs/day for 18 years   ??? Smokeless tobacco: Never Used   ??? Alcohol Use: No      seldom 12 pk per month   ??? Drug Use: No   ??? Sexually Active: Yes -- Male partner(s)     Birth Control/ Protection: None     Other Topics Concern   ??? Not on file     Social History Narrative   ??? No narrative on file       Prior to Admission medications    Medication Sig Start Date End Date Taking? Authorizing Provider   TIZANIDINE HCL (ZANAFLEX PO) Take 1 Tab by mouth two (2) times a day.   Yes Phys Other, MD   HYDROcodone-acetaminophen (NORCO) 10-325 mg tablet Take 1 Tab by mouth every four (4) hours as needed.   Yes Phys Other, MD   gabapentin (NEURONTIN) 300 mg capsule Take 600 mg by mouth four (4) times daily.   Yes Phys Other, MD   omeprazole (PRILOSEC) 20 mg capsule Take 40 mg by mouth daily.   Yes Phys Other, MD   lisinopril (PRINIVIL, ZESTRIL) 20 mg tablet Take 20 mg by mouth two (2) times a day.   Yes Phys Other, MD   fluticasone-salmeterol (ADVAIR DISKUS) 100-50 mcg/dose diskus inhaler Take 1 Puff by inhalation as needed.   Yes Phys Other, MD       Allergies   Allergen Reactions   ??? Darvocet A500 (Propoxyphene N-Acetaminophen) Rash and Nausea Only   ??? Other Rash     Pt states steroid shot causes rash pills are OK   ??? Percocet (Oxycodone-Acetaminophen) Nausea Only     Generic causes nausea name brand is OK    ??? Percocet (Oxycodone-Acetaminophen) Hives   ??? Robaxin (Methocarbamol) Rash   ??? Robaxin (Methocarbamol) Hives   ??? Toradol (Ketorolac Tromethamine) Rash   ??? Toradol (Ketorolac) Hives   ??? Tramadol Rash   ??? Tramadol Hives       Review of Systems:  General:  no weight loss, fever, night sweats  Head:   negative  Eyes:   negative  Respiratory:  Patient denies cough, chest pain, dyspnea, wheezing or hemoptysis  Cardiac:  negative  Gatrointestinal: negative for diarrhea and constipation  Urinary:  Positive for dysuria, urgency, and pain with urination      Objective:      Visit Vitals   Item Reading   ??? BP 114/78   ??? Temp(Src) 98.3 ??F (36.8 ??C) (Oral)   ??? Ht 6\' 1"  (1.854 m)   ??? Wt 226 lb (102.513 kg)   ??? BMI 29.82 kg/m2         Labs:  Urinalysis:    Results for orders placed in visit on 04/16/10   AMB POC URINALYSIS DIP STICK AUTO W/O MICRO     Status: Normal       Component Value Range Status Comment    Color Yellow  (none)  Final     Clarity Clear  (none)  Final     Glucose Negative  (none)  Final     Bilirubin    (none)       Ketones Negative  (none)  Final     Spec.Grav. 1.025  1.001 - 1.035  Final     Blood Trace  (none)  Final     pH  6.0  4.6 - 8.0  Final     Protein neg  JWJ:[id number](FA/OZ) Final     Urobilinogen          Nitrites Negative  (none)  Final     Leukocyte esterase Negative  (none)  Final          Imaging:   VCUG: IMPRESSION: Moderate post void residual volume. The urethra is unremarkable.   Possible diverticulum in the right lateral urinary bladder.      PVR:  NA    SHIM Score: NA    AUA/QOL score:  NA      Physical Exam:   Constitutional: Normal appearing patient with well groomed; No deformities; Body habitus appears normal; Nutritionally sound.   Neck: Supple; No JVD; No cervical lymphadenopathy; No carotid bruits; No masses; trachea midline; No thyromegaly  Respiratory: No use of accessory muscles; No intercostal retractions; Clear to auscultation bilaterally   Heart: Regular rate and rhythm; No murmurs, rubs, gallops  GI: soft, non tender, normal bowel sounds, no palpable masses, no organomegaly    GU:   Deferred    Digital Rectal Exam:   Deferred    Musculoskeletal:   No CVA tenderness; Good sensation, ROM, and strength bilaterally of the Upper and Lower extremities.  His back was extremely tender to  Light palpation at the right CVA.    Assessment/Plan     1.  Microscopic hematuria - Will send urine for UA, culture, and cytology.   2.  Urgency - Patient was advised to increase water intake to at least 64 oz daily and decrease coffees, teas, colas, etc.   3.  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  Appears there may be adhesion of bladder towards inguinal hernia area.  4.  We will refer the patient to [facility] for bladder diverticulum and hernia evaluation by Davinci.  I do not want to do here open due to pain medication limitations, and patient has low pain tolerance.  5.  Mailed patient order to check PSA value.

## 2010-04-16 NOTE — Patient Instructions (Addendum)
MyChart Activation    Thank you for requesting access to MyChart. Please follow the instructions below to securely access and download your online medical record. MyChart allows you to send messages to your doctor, view your test results, renew your prescriptions, schedule appointments, and more.    How Do I Sign Up?    1. In your internet browser, go to [email or web address]  2. Click on the First Time User? Click Here link in the Sign In box. You will be redirect to the New Member Sign Up page.  3. Enter your MyChart Access Code exactly as it appears below. You will not need to use this code after you???ve completed the sign-up process. If you do not sign up before the expiration date, you must request a new code.    MyChart Access Code: [email or web address]  Expires: 07/15/2010  2:08 PM (This is the date your MyChart access code will expire)    4. Enter the last four digits of your Social Security Number (xxxx) and Date of Birth (mm/dd/yyyy) as indicated and click Submit. You will be taken to the next sign-up page.  5. Create a MyChart ID. This will be your MyChart login ID and cannot be changed, so think of one that is secure and easy to remember.  6. Create a MyChart password. You can change your password at any time.  7. Enter your Password Reset Question and Answer. This can be used at a later time if you forget your password.   8. Enter your e-mail address. You will receive e-mail notification when new information is available in MyChart.  9. Click Sign Up. You can now view and download portions of your medical record.  10. Click the Download Summary menu link to download a portable copy of your medical information.    Additional Information    If you have questions, please call [phone]. Remember, MyChart is NOT to be used for urgent needs. For medical emergencies, dial 911.    Blood in the Urine: After Your Visit  Your Care Instructions   Blood in the urine, or hematuria, may make the urine look red, brown, or pink. There may be blood every time you urinate or just from time to time. You cannot always see blood in the urine, but it will show up in a urine test.  Blood in the urine may be serious. It should always be checked by a doctor. Your doctor may recommend more tests, including an X-ray, a CT scan, or a cystoscopy (which lets a doctor look inside the urethra and bladder).  Blood in the urine can be a sign of another problem. Common causes are bladder infections and kidney stones. An injury to your groin or your genital area can also cause bleeding in the urinary tract. Very hard exercise???such as running a marathon???can cause blood in the urine. Blood in the urine can also be a sign of kidney disease or cancer in the bladder or kidney. Many cases of blood in the urine are caused by a harmless condition that runs in families. This is called benign familial hematuria. It does not need any treatment.  Sometimes your urine may look red or brown even though it does not contain blood. For example, not getting enough fluids (dehydration), taking certain medicines, or having a liver problem can change the color of your urine. Eating foods such as beets, rhubarb, or blackberries or foods with red food coloring can make your urine look red or pink.  Follow-up care  is a key part of your treatment and safety. Be sure to make and go to all appointments, and call your doctor if you are having problems. It???s also a good idea to know your test results and keep a list of the medicines you take.  When should you call for help?  Call your doctor now or seek immediate medical care if:  ?? You have symptoms of a urinary infection. For example:  ?? You have pus in your urine.  ?? You have pain in your back just below your rib cage. This is called flank pain.  ?? You have a fever, chills, or body aches.  ?? It hurts to urinate.  ?? You have groin or belly pain.   ?? You have more blood in your urine.  Watch closely for changes in your health, and be sure to contact your doctor if:  ?? You have new urination problems.  ?? You do not get better as expected.   Where can you learn more?   Go to [employer]  Enter [phone] in the search box to learn more about "Blood in the Urine: After Your Visit."   ?? 2006-2011 [employer]. Care instructions adapted under license by [employer] (which disclaims liability or warranty for this information). This care instruction is for use with your licensed healthcare professional. If you have questions about a medical condition or this instruction, always ask your healthcare professional. [employer] disclaims any warranty or liability for your use of this information.  Content Version: 9.1.125182; Last Revised: May 28, 2009

## 2010-04-17 LAB — URINALYSIS W/MICROSCOPIC
Bilirubin: NEGATIVE
Blood: NEGATIVE
Glucose: NEGATIVE MG/DL
Ketone: NEGATIVE MG/DL
Leukocyte Esterase: NEGATIVE
Nitrites: NEGATIVE
Protein: NEGATIVE MG/DL
Specific gravity: 1.025 (ref 1.002–1.030)
Urobilinogen: 0.2 EU/DL (ref 0–1)
pH (UA): 6 (ref 4.5–8.0)

## 2010-04-18 LAB — CULTURE, URINE
Culture result:: NO GROWTH
Culture: NO GROWTH

## 2010-08-20 MED ORDER — BUPRENORPHINE 0.3 MG/ML INJECTION
0.3 mg/mL | Freq: Four times a day (QID) | INTRAMUSCULAR | Status: DC | PRN
Start: 2010-08-20 — End: 2010-08-20

## 2010-08-20 MED ORDER — HYDROCODONE-ACETAMINOPHEN 5 MG-500 MG TAB
5-500 mg | ORAL_TABLET | ORAL | Status: DC | PRN
Start: 2010-08-20 — End: 2011-01-28

## 2010-08-20 MED ORDER — PROMETHAZINE 25 MG/ML INJECTION
25 mg/mL | Freq: Once | INTRAMUSCULAR | Status: DC | PRN
Start: 2010-08-20 — End: 2010-08-20

## 2010-08-20 MED ORDER — PREDNISONE 50 MG TAB
50 mg | ORAL_TABLET | Freq: Every day | ORAL | Status: AC
Start: 2010-08-20 — End: 2010-08-25

## 2010-08-20 NOTE — ED Notes (Signed)
Patient stated he already had a bad back , then he slipped and fell.  When he fell he hit a toy on the floor.

## 2010-08-20 NOTE — ED Notes (Signed)
Patient refused medication due to the fact he could not find a ride home.  Stated he would leave with out it.

## 2010-08-20 NOTE — ED Notes (Signed)
Pt. States he fell yesterday, c/o left hip pain, states he has a bad back and his leg gave out and he fell, denies hitting head or loosing consciouness.

## 2010-08-20 NOTE — ED Notes (Signed)
I have reviewed discharge instructions with the patient.  The patient verbalized understanding. Id band removed and placed in shred box.

## 2010-08-20 NOTE — ED Notes (Signed)
Patient waiting for ride before he can be medicated.

## 2010-08-20 NOTE — ED Provider Notes (Signed)
Patient is a 50 y.o. male presenting with fall and hip pain. The history is provided by the patient.   Fall  The accident occurred yesterday (Pt fell onto toy yesterday sustaqining injury to low back and  L buttock.  No other injuries.  Pt c/o pain localized to the same areas although pt has chronic back pain on a daily basis.). He landed on hard floor. Point of impact: L buttock, low back. Pain location: L buttock and low back. The pain is moderate. He was ambulatory at the scene. There was no entrapment after the fall. There was no drug use involved in the accident. There was no alcohol use involved in the accident. Pertinent negatives include no visual change, no fever, no numbness, no abdominal pain, no bowel incontinence, no nausea, no vomiting, no hematuria, no headaches, no extremity weakness, no loss of consciousness, no tingling and no laceration.   Hip Pain   Associated symptoms include back pain. Pertinent negatives include no numbness and no tingling.        Past Medical History   Diagnosis Date   ??? Hypertension    ??? Asthma      bronchitis   ??? Gastrointestinal disorder      acid reflux   ??? 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 Arthritis    ??? Hyperlipidemia    ??? Chronic pain         Past Surgical History   Procedure Date   ??? Hx orthopaedic      right shoulder   ??? Cystoscopy 12/2009   ??? Hx other surgical      hernia         Family History   Problem Relation Age of Onset   ??? Cancer Mother    ??? Lung Disease Mother    ??? Hypertension Mother    ??? Stroke Father    ??? Diabetes Father    ??? Hypertension Father    ??? Other Other      Family hx of brain tumors   ??? Heart Disease Other    ??? Diabetes Other         History     Social History   ??? Marital Status: Single     Spouse Name: N/A     Number of Children: N/A   ??? Years of Education: N/A     Occupational History   ??? Not on file.     Social History Main Topics    ??? Smoking status: Current Everyday Smoker -- 0.5 packs/day for 18 years   ??? Smokeless tobacco: Never Used   ??? Alcohol Use: No      seldom 12 pk per month   ??? Drug Use: No   ??? Sexually Active: Yes -- Male partner(s)     Birth Control/ Protection: None     Other Topics Concern   ??? Not on file     Social History Narrative   ??? No narrative on file                  ALLERGIES: Codeine; Darvocet a500; Other; Percocet; Percocet; Robaxin; Robaxin; Toradol; Toradol; Tramadol; and Tramadol      Review of Systems   Constitutional: Negative for fever and chills.   HENT: Negative for ear pain, congestion and postnasal drip.    Eyes: Negative for  pain.   Respiratory: Negative for cough and shortness of breath.    Cardiovascular: Negative for chest pain and palpitations.   Gastrointestinal: Negative for nausea, vomiting, abdominal pain and bowel incontinence.   Genitourinary: Negative for dysuria and hematuria.   Musculoskeletal: Positive for back pain. Negative for extremity weakness.        [Back pain and pain in L buttock  Skin: Negative for rash.   Neurological: Negative for tingling, loss of consciousness, weakness, numbness and headaches.       Filed Vitals:    08/20/10 1726   BP: 123/90   Pulse: 95   Temp: 98.9 ??F (37.2 ??C)   Resp: 20   Height: 6' (1.829 m)   Weight: 108.863 kg (240 lb)   SpO2: 99%            Physical Exam   [nursing notereviewed.  Constitutional: He is oriented to person, place, and time. He appears well-developed and well-nourished. He appears distressed.   HENT:   Head: Normocephalic.   Right Ear: External ear normal.   Left Ear: External ear normal.   Nose: Nose normal.   Mouth/Throat: Oropharynx is clear and moist.   Eyes: EOM are normal. Pupils are equal, round, and reactive to light.   Neck: Normal range of motion. Neck supple.   Cardiovascular: Normal rate, regular rhythm and normal heart sounds.    Pulmonary/Chest: Effort normal. He has no wheezes. He has no rales.    Abdominal: Soft. Bowel sounds are normal. There is no tenderness.   Musculoskeletal: Normal range of motion. He exhibits no edema.        Moderate tenderness to palpation over the L buttock and L lateral lumbar region diffusely.   Lymphadenopathy:     He has no cervical adenopathy.   Neurological: He is alert and oriented to person, place, and time. He has normal reflexes.   Skin: Skin is warm and dry. No laceration and no rash noted.   Psychiatric: He has a normal mood and affect.        MDM     Amount and/or Complexity of Data Reviewed:   Tests in the radiology section of CPT??:  [ordered and reviewed      Procedures

## 2010-08-20 NOTE — ED Notes (Signed)
Results for orders placed during the hospital encounter of 04/16/10   CULTURE, URINE       Component Value Range    Specimen Description: CLEAN CATCH      Special Requests: NO SPECIAL REQUESTS      Culture result: NO GROWTH 2 DAYS      Report Status 04/18/2010 FINAL     URINALYSIS W/MICROSCOPIC       Component Value Range    Color YELLOW      Appearance CLEAR      Specific gravity 1.025  1.002 - 1.030 ( )    pH 6.0  4.5 - 8.0 ( )    Protein NEGATIVE       Glucose NEGATIVE       Ketone NEGATIVE       Bilirubin NEGATIVE       Blood NEGATIVE       Urobilinogen 0.2  0 - 1 (EU/DL)    Nitrites NEGATIVE       Leukocyte Esterase NEGATIVE       WBC NONE      RBC 0-3      Bacteria 1+      Epithelial cells NONE       XR SPINE LUMB 2 OR 3 V    (Results Pending)   XR PELVIS MAX 2 VIEWS    (Results Pending)   XR SPINE LUMB MIN 4 V    (Results Pending)     Scoliosis, o/w NAD

## 2010-09-13 MED ORDER — ACETAMINOPHEN-CODEINE 300 MG-30 MG TAB
300-30 mg | ORAL_TABLET | ORAL | Status: DC | PRN
Start: 2010-09-13 — End: 2011-01-28

## 2010-09-13 MED ORDER — PROMETHAZINE 25 MG TAB
25 mg | ORAL_TABLET | Freq: Four times a day (QID) | ORAL | Status: DC | PRN
Start: 2010-09-13 — End: 2011-01-28

## 2010-09-13 NOTE — ED Provider Notes (Signed)
Patient is a 50 y.o. male presenting with fall. The history is provided by the patient.   Fall  The accident occurred yesterday. The fall occurred while walking. He fell from a height of ground level. He landed on grass. Point of impact: low back. Pain location: low back. The pain is moderate. He was ambulatory at the scene. Pertinent negatives include no fever, no numbness, no abdominal pain, no nausea, no vomiting, no hematuria, no extremity weakness, no loss of consciousness and no laceration. Risk factors: chronic back pain.  The symptoms are aggravated by pressure on injury and activity. Treatments tried: home meds. The treatment provided mild relief.        Past Medical History   Diagnosis Date   ??? Hypertension    ??? Asthma      bronchitis   ??? Gastrointestinal disorder      acid reflux   ??? 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 Arthritis    ??? Hyperlipidemia    ??? Chronic pain         Past Surgical History   Procedure Date   ??? Hx orthopaedic      right shoulder   ??? Cystoscopy 12/2009   ??? Hx other surgical      hernia         Family History   Problem Relation Age of Onset   ??? Cancer Mother    ??? Lung Disease Mother    ??? Hypertension Mother    ??? Stroke Father    ??? Diabetes Father    ??? Hypertension Father    ??? Other Other      Family hx of brain tumors   ??? Heart Disease Other    ??? Diabetes Other         History     Social History   ??? Marital Status: Married     Spouse Name: N/A     Number of Children: N/A   ??? Years of Education: N/A     Occupational History   ??? Not on file.     Social History Main Topics   ??? Smoking status: Current Everyday Smoker -- 0.5 packs/day for 18 years   ??? Smokeless tobacco: Never Used   ??? Alcohol Use: No      seldom 12 pk per month   ??? Drug Use: No   ??? Sexually Active: Yes -- Male partner(s)     Birth Control/ Protection: None     Other Topics Concern   ??? Not on file     Social History Narrative    ??? No narrative on file                  ALLERGIES: Codeine; Darvocet a500; Other; Percocet; Percocet; Robaxin; Robaxin; Toradol; Toradol; Tramadol; and Tramadol      Review of Systems   Constitutional: Negative for fever.   HENT: Negative for neck pain.    Respiratory: Negative for shortness of breath.    Cardiovascular: Negative for chest pain and leg swelling.   Gastrointestinal: Negative for nausea, vomiting and abdominal pain.   Genitourinary: Negative for hematuria and difficulty urinating.        No loss of B/B control   Musculoskeletal: Positive for back pain. Negative for extremity weakness.   Skin: Negative for wound.   Neurological: Negative for loss  of consciousness, syncope and numbness.   Psychiatric/Behavioral: Negative for agitation.       Filed Vitals:    09/13/10 0753   BP: 118/77   Pulse: 90   Temp: 98 ??F (36.7 ??C)   Resp: 20   Height: 6' (1.829 m)   Weight: 108.863 kg (240 lb)   SpO2: 100%            Physical Exam   Nursing note and vitals reviewed.  Constitutional: No distress.   HENT:   Head: Normocephalic and atraumatic.   Eyes: No scleral icterus.   Pulmonary/Chest: Effort normal. No stridor.   Abdominal: Soft. He exhibits no distension and no mass. There is no tenderness. There is no rebound and no guarding.   Musculoskeletal:        Tender at L low back, neg str leg tests B, poor pat reflexes B, but equal   Skin: No laceration noted. He is not diaphoretic.        MDM    Procedures    Xr= NAD

## 2010-09-13 NOTE — ED Notes (Signed)
States he fell on a hill yesterday and now has low back pain.

## 2010-09-13 NOTE — ED Notes (Signed)
Ambulated to rad

## 2010-09-13 NOTE — ED Notes (Signed)
Pt tells me he is NOT allergic to CODEINE

## 2010-09-13 NOTE — Progress Notes (Signed)
..  I have reviewed discharge instructions with the patient.  The patient verbalized understanding.

## 2011-01-28 LAB — AMB POC CREATININE ASSAY, OTHER SOURCE
Creatinine, other source: 100
Source: NORMAL

## 2011-01-28 LAB — AMB POC URINALYSIS DIP STICK AUTO W/O MICRO
Blood (UA POC): NEGATIVE
Glucose (UA POC): NEGATIVE
Ketones (UA POC): NEGATIVE
Leukocyte esterase (UA POC): NEGATIVE
Nitrites (UA POC): NEGATIVE
Protein (UA POC): NEGATIVE mg/dL
Specific gravity (UA POC): 1.015 (ref 1.001–1.035)
pH (UA POC): 7 (ref 4.6–8.0)

## 2011-01-28 NOTE — Progress Notes (Signed)
Male Progress Note      Patient: Jesse Montoya               Sex: male             MRN: [id number]      Date of Birth:  [birth year]      Age:  50 y.o.               Subjective:     Jesse Montoya is a 50 y.o. male who is being seen for follow-up for bladder diverticulum.  He saw Dr. Jeanice Lim for this problem and was then sent to another general surgeon.  He had a bilateral hernia repair up at [facility] but didn't have his umbilical hernia repaired.  He did not have any direct surgeries on his bladder, but they did remove a portion of his bladder from his inguinal canal when that was repair.  He now recently had an CT scan at [facility] which he states found a growth on his kidney.  The report from there shows a small cyst on his left kidney.  He continues to have groin pain following the surgery.  He states that he is also having "kidney" pain, which is unable to describe.  The patient states he has also been having bright red blood in his semen, that was dripping out of his penis.  The patient has been drinking 1/2-2 gallons of water daily.  The patient states he had his PSA checked this summer by Dr. Shanon Brow.    No fevers or chills.  No CP or SOB.  No N/V/D.  Occasional dysuria.    I recommended to the patient today to have a DRE in evaluation of his hematospermia.  He did defer that exam today.  I explained the risk of missing prostate cancer by not preforming this exam.  He still wishes to defer it, stating that Dr. Shanon Brow had recently performed one and it was normal.    >15 minutes were spent in face-to-face time with the patient at his visit today, 50% of which were spent discussing treatment options and outcomes.     Past Medical History   Diagnosis Date   ??? Hypertension    ??? Asthma      bronchitis   ??? Gastrointestinal disorder      acid reflux   ??? 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 Arthritis    ??? Hyperlipidemia    ??? Chronic pain         Past Surgical History   Procedure Date   ??? Hx orthopaedic      right shoulder   ??? Cystoscopy 12/2009   ??? Hx other surgical      hernia   ??? Pr abdomen surgery proc unlisted      Hernia repair       Family History   Problem Relation Age of Onset   ??? Cancer Mother    ??? Lung Disease Mother    ??? Hypertension Mother    ??? Stroke Father    ??? Diabetes Father    ??? Hypertension Father    ??? Other Other      Family hx of brain tumors   ??? Heart Disease Other    ??? Diabetes Other        History     Social  History   ??? Marital Status: Single     Spouse Name: N/A     Number of Children: N/A   ??? Years of Education: N/A     Social History Main Topics   ??? Smoking status: Current Everyday Smoker -- 0.5 packs/day for 18 years   ??? Smokeless tobacco: Never Used   ??? Alcohol Use: No      seldom 12 pk per month   ??? Drug Use: No   ??? Sexually Active: Yes -- Male partner(s)     Birth Control/ Protection: None     Other Topics Concern   ??? Not on file     Social History Narrative   ??? No narrative on file       Prior to Admission medications    Medication Sig Start Date End Date Taking? Authorizing Provider   oxyCODONE-acetaminophen (PERCOCET) 10-325 mg per tablet Take 1 Tab by mouth.     Yes Historical Provider   zolpidem (AMBIEN) 5 mg tablet Take  by mouth nightly as needed.     Yes Historical Provider   TIZANIDINE HCL (ZANAFLEX PO) Take 1 Tab by mouth two (2) times a day.   Yes Phys Other, MD   gabapentin (NEURONTIN) 300 mg capsule Take 600 mg by mouth four (4) times daily.   Yes Phys Other, MD   lisinopril (PRINIVIL, ZESTRIL) 20 mg tablet Take 20 mg by mouth two (2) times a day.   Yes Phys Other, MD       Allergies   Allergen Reactions   ??? Darvocet A500 (Propoxyphene N-Acetaminophen) Rash and Nausea Only   ??? Other Rash     Pt states steroid shot causes rash pills are OK   ??? Robaxin (Methocarbamol) Rash   ??? Toradol (Ketorolac) Hives   ??? Tramadol Rash       Review of Systems:  General:  no weight loss, fever, night sweats  Head:   negative   Eyes:   negative  Respiratory:  Patient denies cough, chest pain, dyspnea, wheezing or hemoptysis  Cardiac:  negative  Gatrointestinal: negative      Objective:      Visit Vitals   Item Reading   ??? BP 125/78   ??? Temp(Src) 98.3 ??F (36.8 ??C) (Oral)   ??? Ht 6' (1.829 m)   ??? Wt 219 lb (99.338 kg)   ??? BMI 29.70 kg/m2         Labs:  Urinalysis:    Results for orders placed in visit on 01/28/11   AMB POC URINALYSIS DIP STICK AUTO W/O MICRO     Status: Normal       Component Value Range Status Comment    Color Yellow  (none) Final     Clarity Clear  (none) Final     Glucose Negative  (none) Final     Bilirubin    (none)      Ketones Negative  (none) Final     Spec.Grav. 1.015  1.001 - 1.035 Final     Blood Negative  (none) Final     pH 7.0  4.6 - 8.0 Final     Protein neg  Negative mg/dL Final     Urobilinogen    0.2 - 1      Nitrites Negative  (none) Final     Leukocyte esterase Negative  (none) Final          Imaging:   CT from [facility]: no acute abnormality. Small left renal cyst  Physical Exam:   Constitutional: Normal appearing patient with well groomed; No deformities; Body habitus appears normal; Nutritionally sound.   Neck: Supple; No JVD; No cervical lymphadenopathy; No carotid bruits; No masses; trachea midline; No thyromegaly  Respiratory: No use of accessory muscles; No intercostal retractions; Clear to auscultation bilaterally  Heart: Regular rate and rhythm; No murmurs, rubs, gallops  GI: soft, non tender, normal bowel sounds, no palpable masses, no organomegaly    GU:   Deferred by patient.    Digital Rectal Exam:   Deferred by patient.    Musculoskeletal:   No CVA tenderness; Good sensation, ROM, and strength bilaterally of the Upper and Lower extremities.    Assessment/Plan     1. Microscopic hematuria, none today - Will send urine for UA and cytology.   2. Left renal cyst - renal ultrasound  3. Possible bladder diverticulum, resolved with hernia repair - No further treatment.   4. Hematospermia - the patient deferred prostate and gu exam today.  We will get him most recent PSA from [place].    5. Dysuria - Patient was advised to increase water intake to at least 64 oz daily and decrease coffee, colas, teas, etc.   6. We will follow-up with the patient in one year.

## 2011-01-28 NOTE — Patient Instructions (Addendum)
MyChart Activation    Thank you for requesting access to MyChart. Please follow the instructions below to securely access and download your online medical record. MyChart allows you to send messages to your doctor, view your test results, renew your prescriptions, schedule appointments, and more.    How Do I Sign Up?    1. In your internet browser, go to [email or web address].  2. Click on the First Time User? Click Here link in the Sign In box. You will see the New Member Sign Up page.  3. Enter your MyChart Access Code exactly as it appears below. You will not need to use this code after you???ve completed the sign-up process. If you do not sign up before the expiration date, you must request a new code.    MyChart Access Code: [email or web address]  Expires: 04/28/2011  2:00 PM (This is the date your MyChart access code will expire)    4. Enter the last four digits of your Social Security Number (xxxx) and Date of Birth (mm/dd/yyyy) as indicated and click Submit. You will be taken to the next sign-up page.  5. Create a MyChart ID. This will be your MyChart login ID and cannot be changed, so think of one that is secure and easy to remember.  6. Create a MyChart password. You can change your password at any time.  7. Enter your Password Reset Question and Answer. This can be used at a later time if you forget your password.   8. Enter your e-mail address. You will receive e-mail notification when new information is available in MyChart.  9. Click Sign Up. You can now view and download portions of your medical record.  10. Click the Download Summary menu link to download a portable copy of your medical information.    Additional Information    If you have questions, please visit the Frequently Asked Questions section of the MyChart website at [email or web address] Remember, MyChart is NOT to be used for urgent needs. For medical emergencies, dial 911.         Ultrasound of the Abdomen: About This Test  What is it?  An abdominal ultrasound uses reflected sound waves to produce a picture of the organs and blood vessels in the upper belly. These include your liver, gallbladder, spleen, pancreas, kidneys, and major blood vessels like the aorta. The sound waves create a picture on a video monitor.  Why is this test done?  An ultrasound can be done to:  ?? Find out what's causing belly pain???a gallstone, for example.  ?? Look at a lump or mass that was found in a prior exam and, in some cases, tell what it is.  ?? Check for problems in the liver and kidneys.  ?? Look for changes in an aortic aneurysm, a bulging section in the wall of the large artery that carries blood out of the heart.  ?? Find fluid in the belly.  ?? Guide needles or other medical instruments in treatment.  How can you prepare for the test?  ?? Depending on the part of your belly your doctor will be looking at, you may need to eat a fat-free meal the night before your test. Or you may need to avoid eating for 4 to 12 hours before the test. Your doctor will tell you what to do.  What happens before the test?  ?? You will need to take off any jewelry that might interfere with the ultrasound test.  ?? You will need to take  off your clothes around the area to be scanned. You will get a cloth or paper covering to use during the test.  What happens during the test?  ?? You lie down on your back or your side on an exam table.  ?? The doctor or technologist spreads some warm gel on your belly to improve the transmission of the sound waves. A small handheld unit called a transducer is pressed against your belly and is moved back and forth. A picture of the organs and blood vessels can be seen on a video monitor.  ?? You need to lie still. You may be asked to take a breath and hold it for several seconds during the scanning.  What else should you know about the test?  ?? The ultrasound is a very safe procedure.   ?? There is no discomfort from the test itself. If your belly hurts already, you will feel some pain from the probe being pressed down on it.  ?? You will not hear or feel the sound waves.  How long does the test take?  ?? The test will take about 30 minutes.  ?? You may be asked to wait until the radiologist has reviewed the scan. He or she may want to do more ultrasound views of some areas of your belly.  What happens after the test?  ?? You will probably be able to go home right away.  ?? You can go back to your usual activities right away.  Follow-up care is a key part of your treatment and safety. Be sure to make and go to all appointments, and call your doctor if you are having problems. It's also a good idea to keep a list of the medicines you take. Ask your doctor when you can expect to have your test results.   Where can you learn more?   Go to [email or web address]  Enter T376 in the search box to learn more about "Ultrasound of the Abdomen: About This Test."   ?? 2006-2012 [employer]. Care instructions adapted under license by [employer] (which disclaims liability or warranty for this information). This care instruction is for use with your licensed healthcare professional. If you have questions about a medical condition or this instruction, always ask your healthcare professional. [employer] Inc[employer] disclaims any warranty or liability for your use of this information.  Content Version: 9.4.94723; Last Revised: May 31, 2010

## 2011-01-29 LAB — URINALYSIS W/MICROSCOPIC
Bilirubin: NEGATIVE
Blood: NEGATIVE
Glucose: NEGATIVE MG/DL
Ketone: NEGATIVE MG/DL
Leukocyte Esterase: NEGATIVE
Nitrites: NEGATIVE
Protein: NEGATIVE MG/DL
Specific gravity: 1.015 (ref 1.002–1.030)
Urobilinogen: 0.2 EU/DL (ref 0–1)
pH (UA): 7 (ref 4.5–8.0)

## 2011-02-04 LAB — URINALYSIS W/ RFLX MICROSCOPIC
Bilirubin: NEGATIVE
Blood: NEGATIVE
Glucose: NEGATIVE MG/DL
Ketone: NEGATIVE MG/DL
Leukocyte Esterase: NEGATIVE
Nitrites: NEGATIVE
Protein: NEGATIVE MG/DL
Specific gravity: 1.01 (ref 1.002–1.030)
Urobilinogen: 0.2 EU/DL (ref 0–1)
pH (UA): 6.5 (ref 4.5–8.0)

## 2011-02-04 MED ORDER — ONDANSETRON (PF) 4 MG/2 ML INJECTION
4 mg/2 mL | INTRAMUSCULAR | Status: AC
Start: 2011-02-04 — End: 2011-02-04
  Administered 2011-02-04: 15:00:00 via INTRAMUSCULAR

## 2011-02-04 MED ORDER — ONDANSETRON 4 MG TAB, RAPID DISSOLVE
4 mg | ORAL_TABLET | Freq: Three times a day (TID) | ORAL | Status: DC | PRN
Start: 2011-02-04 — End: 2011-08-21

## 2011-02-04 NOTE — Other (Signed)
..  I have reviewed discharge instructions with the patient.  The patient verbalized understanding.

## 2011-02-04 NOTE — ED Notes (Addendum)
States has had left flank pain for approximately 4 months, but past 3-4 days has progressively been getting worse.  Also c/o urinary frequency & burning and nausea

## 2011-02-04 NOTE — Progress Notes (Signed)
.  [place] of kidneys completed 9:09 AM by Skylyn Slezak H Ndea Kilroy, RT(R)

## 2011-02-04 NOTE — ED Provider Notes (Signed)
Patient is a 50 y.o. male presenting with flank pain. The history is provided by the patient. No language interpreter was used.   Flank Pain   This is a chronic problem. The current episode started more than 1 week ago. The problem has not changed since onset.The problem occurs constantly. Patient reports no work related injury.The pain is associated with no known injury. The pain is present in the left side. The quality of the pain is described as aching. The pain does not radiate. The pain is at a severity of 8/10. The pain is moderate. Associated symptoms include dysuria. Pertinent negatives include no chest pain, no fever, no numbness, no weight loss, no headaches, no abdominal pain, no abdominal swelling, no bowel incontinence, no perianal numbness, no bladder incontinence, no pelvic pain, no leg pain and no weakness.        Past Medical History   Diagnosis Date   ??? Hypertension    ??? Asthma      bronchitis   ??? Gastrointestinal disorder      acid reflux   ??? 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 Arthritis    ??? Hyperlipidemia    ??? Chronic pain    ??? Other ill-defined conditions      Hernia x 3 near umbilical area, bladder problems;  Glaucoma   ??? Diabetes      Borderline Diabetic        Past Surgical History   Procedure Date   ??? Hx orthopaedic      right shoulder   ??? Cystoscopy 12/2009   ??? Hx other surgical      hernia   ??? Pr abdomen surgery proc unlisted      Hernia repair         Family History   Problem Relation Age of Onset   ??? Cancer Mother    ??? Lung Disease Mother    ??? Hypertension Mother    ??? Stroke Father    ??? Diabetes Father    ??? Hypertension Father    ??? Other Other      Family hx of brain tumors   ??? Heart Disease Other    ??? Diabetes Other         History     Social History   ??? Marital Status: Married     Spouse Name: N/A     Number of Children: N/A   ??? Years of Education: N/A     Occupational History   ??? Not on file.     Social History Main Topics    ??? Smoking status: Current Everyday Smoker -- 0.5 packs/day for 18 years   ??? Smokeless tobacco: Never Used   ??? Alcohol Use: No      seldom 12 pk per month   ??? Drug Use: No   ??? Sexually Active: Yes -- Male partner(s)     Birth Control/ Protection: None     Other Topics Concern   ??? Not on file     Social History Narrative   ??? No narrative on file                  ALLERGIES: Darvocet a500; Other; Robaxin; Toradol; and Tramadol      Review of Systems   Constitutional: Negative for fever, weight loss, activity change, appetite change and fatigue.   HENT: Negative for ear pain, congestion, sore throat, rhinorrhea, trouble swallowing, neck pain, neck stiffness and sinus pressure.  Eyes: Negative.    Respiratory: Negative for cough, chest tightness, shortness of breath and wheezing.    Cardiovascular: Negative for chest pain, palpitations and leg swelling.   Gastrointestinal: Negative for nausea, vomiting, abdominal pain, diarrhea, constipation, abdominal distention and bowel incontinence.   Genitourinary: Positive for dysuria and flank pain. Negative for bladder incontinence, urgency, frequency, hematuria, decreased urine volume and pelvic pain.   Musculoskeletal: Negative for myalgias, back pain and joint swelling.   Skin: Negative for color change, pallor and rash.   Neurological: Negative for dizziness, tremors, weakness, light-headedness, numbness and headaches.   Hematological: Negative for adenopathy.   Psychiatric/Behavioral: Negative for behavioral problems, confusion and agitation. The patient is not nervous/anxious.        Filed Vitals:    02/04/11 0918 02/04/11 0921   BP: 125/72    Pulse: 70    Temp: 97.9 ??F (36.6 ??C)    Resp: 18    Height: 6' (1.829 m) 6' (1.829 m)   Weight:  99.479 kg (219 lb 5 oz)   SpO2: 99%             Physical Exam   Nursing note and vitals reviewed.  Constitutional: He is oriented to person, place, and time. He appears well-developed and well-nourished. He is active.   HENT:    Head: Normocephalic and atraumatic.   Right Ear: External ear normal.   Left Ear: External ear normal.   Nose: Nose normal.   Mouth/Throat: Oropharynx is clear and moist.   Eyes: Conjunctivae and EOM are normal. Pupils are equal, round, and reactive to light.   Neck: Normal range of motion. Neck supple. No tracheal deviation present. No thyromegaly present.   Cardiovascular: Normal rate, regular rhythm, normal heart sounds and normal pulses.    No murmur heard.  Pulmonary/Chest: Effort normal and breath sounds normal.   Abdominal: Soft. Normal appearance and bowel sounds are normal. He exhibits no distension and no mass. There is no tenderness.   Musculoskeletal: Normal range of motion.   Lymphadenopathy:     He has no cervical adenopathy.   Neurological: He is alert and oriented to person, place, and time. He has normal strength and normal reflexes. Coordination normal.   Skin: Skin is warm and dry. No rash noted.   Psychiatric: He has a normal mood and affect. His behavior is normal. Judgment and thought content normal.        MDM     Amount and/or Complexity of Data Reviewed:   Clinical lab tests:  Reviewed  Tests in the radiology section of CPT??:  Reviewed  Tests in the medicine section of the CPT??:  Reviewed  Discussion of test results with the performing providers:  No   Decide to obtain previous medical records or to obtain history from someone other than the patient:  No   Obtain history from someone other than the patient:  No   Review and summarize past medical records:  No   Discuss the patient with another provider:  No   Independant visualization of image, tracing, or specimen:  No  Risk of Significant Complications, Morbidity, and/or Mortality:   Presenting problems:  Moderate  Progress:   Patient progress:  Stable      Procedures

## 2011-07-31 LAB — ELECTROLYTES
Anion gap: 6 mmol/L (ref 6–15)
CO2: 28 MMOL/L (ref 21–32)
Chloride: 106 MMOL/L (ref 98–107)
Potassium: 4 MMOL/L (ref 3.5–5.3)
Sodium: 140 MMOL/L (ref 136–145)

## 2011-07-31 LAB — CREATININE: Creatinine: 1 MG/DL (ref 0.60–1.30)

## 2011-07-31 LAB — HEMOGLOBIN: HGB: 15.2 g/dL (ref 13.5–17.5)

## 2011-07-31 NOTE — Progress Notes (Signed)
CXR COMPLETE AT 11:01 AM WITH NO COMPLICATIONS.    Jesse Montoya C Stryker Veasey

## 2011-07-31 NOTE — Other (Signed)
Armband checked, name, DOB, and procedure verified with patient.

## 2011-08-01 LAB — EKG, 12 LEAD, INITIAL
Atrial Rate: 85 {beats}/min
Calculated P Axis: 56 degrees
Calculated R Axis: -11 degrees
Calculated T Axis: 43 degrees
Diagnosis: NORMAL
P-R Interval: 152 ms
Q-T Interval: 346 ms
QRS Duration: 94 ms
QTC Calculation (Bezet): 411 ms
Ventricular Rate: 85 {beats}/min

## 2011-08-21 ENCOUNTER — Inpatient Hospital Stay: Payer: MEDICARE

## 2011-08-21 LAB — GLUCOSE, POC: Glucose (POC): 104 mg/dL (ref 70–110)

## 2011-08-21 MED ORDER — ONDANSETRON (PF) 4 MG/2 ML INJECTION
4 mg/2 mL | Freq: Once | INTRAMUSCULAR | Status: AC
Start: 2011-08-21 — End: 2011-08-21
  Administered 2011-08-21: 14:00:00 via INTRAVENOUS

## 2011-08-21 MED ORDER — ONDANSETRON (PF) 4 MG/2 ML INJECTION
4 mg/2 mL | Freq: Once | INTRAMUSCULAR | Status: DC | PRN
Start: 2011-08-21 — End: 2011-08-21
  Administered 2011-08-21: 15:00:00 via INTRAVENOUS

## 2011-08-21 MED ORDER — MIDAZOLAM 1 MG/ML IJ SOLN
1 mg/mL | Freq: Once | INTRAMUSCULAR | Status: AC
Start: 2011-08-21 — End: 2011-08-21
  Administered 2011-08-21: 14:00:00 via INTRAVENOUS

## 2011-08-21 MED ORDER — FAMOTIDINE (PF) 20 MG/2 ML IV
20 mg/2 mL | Freq: Once | INTRAVENOUS | Status: AC
Start: 2011-08-21 — End: 2011-08-21
  Administered 2011-08-21: 14:00:00 via INTRAVENOUS

## 2011-08-21 MED ORDER — SODIUM CHLORIDE 0.9 % NEB SOLUTION
0.9 % | RESPIRATORY_TRACT | Status: DC | PRN
Start: 2011-08-21 — End: 2011-08-21

## 2011-08-21 MED ORDER — FENTANYL CITRATE (PF) 50 MCG/ML IJ SOLN
50 mcg/mL | INTRAMUSCULAR | Status: AC | PRN
Start: 2011-08-21 — End: 2011-08-21
  Administered 2011-08-21 (×8): via INTRAVENOUS

## 2011-08-21 MED ORDER — ALBUTEROL SULFATE 2.5 MG/0.5 ML NEB SOLUTION
2.5 mg/0.5 mL | RESPIRATORY_TRACT | Status: AC
Start: 2011-08-21 — End: 2011-08-21
  Administered 2011-08-21: 14:00:00 via RESPIRATORY_TRACT

## 2011-08-21 MED ORDER — OXYCODONE-ACETAMINOPHEN 10 MG-325 MG TAB
10-325 mg | ORAL_TABLET | Freq: Four times a day (QID) | ORAL | Status: DC | PRN
Start: 2011-08-21 — End: 2012-07-02

## 2011-08-21 MED ORDER — OXYCODONE-ACETAMINOPHEN 10 MG-325 MG TAB
10-325 mg | Freq: Once | ORAL | Status: AC
Start: 2011-08-21 — End: 2011-08-21
  Administered 2011-08-21: 17:00:00 via ORAL

## 2011-08-21 MED ORDER — SODIUM CHLORIDE 0.9 % IV PIGGY BACK
1 gram | Freq: Once | INTRAVENOUS | Status: AC
Start: 2011-08-21 — End: 2011-08-21
  Administered 2011-08-21: 14:00:00 via INTRAVENOUS

## 2011-08-21 MED ADMIN — sodium chloride irrigation 0.9 % irrigation: @ 15:00:00 | NDC 00409613803

## 2011-08-21 MED ADMIN — methylPREDNISolone acetate (DEPO-MEDROL) 40 mg/mL injection: @ 15:00:00 | NDC 00009307301

## 2011-08-21 MED ADMIN — lactated ringers infusion: INTRAVENOUS | @ 14:00:00 | NDC 00409795309

## 2011-08-21 MED FILL — NORMAL SALINE FLUSH 0.9 % INJECTION SYRINGE: INTRAMUSCULAR | Qty: 10

## 2011-08-21 MED FILL — CEFAZOLIN 1 GRAM IV SOLUTION: 1 gram | INTRAVENOUS | Qty: 1000

## 2011-08-21 MED FILL — OXYCODONE-ACETAMINOPHEN 10 MG-325 MG TAB: 10-325 mg | ORAL | Qty: 1

## 2011-08-21 MED FILL — SODIUM CHLORIDE 0.9 % NEB SOLUTION: 0.9 % | RESPIRATORY_TRACT | Qty: 3

## 2011-08-21 MED FILL — FAMOTIDINE (PF) 20 MG/2 ML IV: 20 mg/2 mL | INTRAVENOUS | Qty: 2

## 2011-08-21 MED FILL — PROPOFOL 10 MG/ML IV EMUL: 10 mg/mL | INTRAVENOUS | Qty: 20

## 2011-08-21 MED FILL — LACTATED RINGERS IV: INTRAVENOUS | Qty: 1000

## 2011-08-21 MED FILL — ONDANSETRON (PF) 4 MG/2 ML INJECTION: 4 mg/2 mL | INTRAMUSCULAR | Qty: 2

## 2011-08-21 MED FILL — FENTANYL CITRATE (PF) 50 MCG/ML IJ SOLN: 50 mcg/mL | INTRAMUSCULAR | Qty: 3

## 2011-08-21 MED FILL — FENTANYL CITRATE (PF) 50 MCG/ML IJ SOLN: 50 mcg/mL | INTRAMUSCULAR | Qty: 2

## 2011-08-21 MED FILL — DEPO-MEDROL 40 MG/ML SUSPENSION FOR INJECTION: 40 mg/mL | INTRAMUSCULAR | Qty: 2

## 2011-08-21 MED FILL — SODIUM CHLORIDE 0.9 % IRRIGATION SOLN: 0.9 % | Qty: 3000

## 2011-08-21 MED FILL — FENTANYL CITRATE (PF) 50 MCG/ML IJ SOLN: 50 mcg/mL | INTRAMUSCULAR | Qty: 5

## 2011-08-21 MED FILL — ALBUTEROL SULFATE 2.5 MG/0.5 ML NEB SOLUTION: 2.5 mg/0.5 mL | RESPIRATORY_TRACT | Qty: 0.5

## 2011-08-21 MED FILL — MIDAZOLAM 1 MG/ML IJ SOLN: 1 mg/mL | INTRAMUSCULAR | Qty: 2

## 2011-08-21 MED FILL — LIDOCAINE (PF) 20 MG/ML (2 %) IV SYRINGE: 100 mg/5 mL (2 %) | INTRAVENOUS | Qty: 5

## 2011-08-21 NOTE — Op Note (Signed)
[facility]      PT Name:  Jesse Montoya, Jesse Montoya         Admitted:  08/21/2011  MR#:  [id number]                     DOB:  October 12, 1960  Account #:  [id number]            Age:  51  Dictator:  Marquin Patino. Lendell Caprice, M.D.     Location:      OPERATIVE REPORT    SURGERY DATE: 08/21/2011    PREOPERATIVE DIAGNOSIS:  Painful knees    POSTOPERATIVE DIAGNOSIS:  1. Right knee torn lateral meniscus, plica syndrome and arthritis  2. Left knee torn lateral meniscus and arthritis    OPERATION:  1. Right knee arthroscopy, excision of plica, debridement of medial  femoral condyle and patellofemoral joint and lateral meniscectomy  2. Left knee arthroscopy, debridement of patellofemoral joint and  partial lateral meniscectomy    DESCRIPTION OF OPERATIVE PROCEDURE:  The patient was taken to the Operating Theatre and given a General  anesthetic with oral tracheal intubation. Both knees were prepped  and draped in the usual fashion. The pneumatic tourniquet was  employed and inflated to 350 mmHg pressure on the right side first.  The scope, irrigation system and probe were introduced into the knee  in the routine fashion.  On examination, there was a firm fibrous  plica in the medial compartment removed with the radial resector.  There was a tear of the anterior horn of the lateral meniscus from 6  o'clock to 10 o'clock, representing 40% of the width of the meniscus  on the undersurface.  The torn portion was removed and the remaining  meniscus was carefully contoured and felt to be stable.   There were  degenerative changes of the lateral tibial plateau, medial femoral  condyle and patellofemoral joint.  The fibular cartilage was taken  down with a radial resector to a stable rim.  There was no bare  bone.   The medial meniscus was normal.  The anterior cruciate was  normal.  The scope, irrigation system and probe were removed from  the knee in the routine fashion. The wounds were closed with  interrupted 3-0  Nylon. A compression dressing was then placed on the  knee. The tourniquet was let down.  Tourniquet time was 10 minutes.    Attention was then directed to the left knee. The pneumatic  tourniquet was employed and inflated to 350 mmHg pressure.  The  scope, irrigation system and probe were introduced into the knee in  the routine fashion.  On examination, the medial compartment was  normal.  The anterior cruciate was normal.  The lateral meniscus  showed a tear from the 2 o'clock to 6 o'clock position, representing  45% of the width of the meniscus on the undersurface of the  meniscus.  The torn portion was removed and the remaining meniscus  was carefully contoured and felt to be stable.   There were  degenerative changes of the lateral tibial plateau.  The fibular  cartilage was taken down with a radial resector to a stable rim.  There was no bare bone.   There were degenerative changes on the  undersurface of the patella.  Fibular cartilage was taken down with  radial resector to a stable rim.  There was no bare bone.  The  remainder of the knee examination was normal.  The scope,  irrigation  system and probe were removed from the knee in the routine fashion.  The wounds were closed with interrupted 3-0 Nylon.  A compression  dressing was then placed on the knee. The tourniquet was let down.  Tourniquet time was 10 minutes.   The patient was awakened and taken  to the Recovery Room in satisfactory condition.                _____________________________  Ameila Weldon. Lendell Caprice, M.D.    TRL:klg DD: 08/21/2011 10:54:44  DT: 08/21/2011 13:42:50 Job ID:  [id number]  CC:      Document #:  [id number]

## 2011-08-21 NOTE — Other (Signed)
Discharge instructions reviewed with patient and wife.  State understanding.  E-signature pad is not working at present.

## 2011-08-21 NOTE — Progress Notes (Signed)
Post - Anesthesia Evaluation & Assessment Note:    Name: Jesse Montoya  Age: 52 y.o.  Sex: male  DOB: Oct 14, 1960  MRN: [id number]  CSN: [id number]     Patient is s/p (type of anesthesia): GA    Visit Vitals   Item Reading   ??? BP 133/73   ??? Pulse 82   ??? Temp 97.6 ??F (36.4 ??C)   ??? Resp 10   ??? Ht 6' (1.829 m)   ??? Wt 95.709 kg (211 lb)   ??? BMI 28.62 kg/m2   ??? SpO2 98%     Nausea/Vomiting: no nausea and no vomiting       Post-operative hydration adequate.      Pain score: 5      Mental status & level of consciousness: somewhat drowsy, but oriented x 3.    Neurological status: moves all extremities, sensation grossly intact.      Pulmonary status: airway patent, receiving supplemental oxygen.      No cyanosis noted.  Swallowing and gag reflex intact/normal.      Complications related to anesthesia: None.     Additional comments: None.      Patient has met all discharge requirements.       Dian Situ, MD    08/21/2011 12:39 PM

## 2011-08-21 NOTE — H&P (Signed)
Date of Surgery Update:  Jesse Montoya was seen and examined.  History and physical has been reviewed. There have been no significant clinical changes since the completion of the originally dated History and Physical.    Signed By: Gaylord Shih, MD     August 21, 2011 10:55 AM

## 2011-08-21 NOTE — Op Note (Signed)
[facility]      PT Name:  Jesse Montoya, Jesse Montoya         Admitted:  08/21/2011  MR#:  [id number]                     DOB:  09/04/1960  Account #:  [id number]            Age:  51  Dictator:  Baylynn Shifflett. Robert Daci Stubbe, M.D.     Location:      OPERATIVE REPORT    SURGERY DATE: 08/21/2011    PREOPERATIVE DIAGNOSIS:  Painful knees    POSTOPERATIVE DIAGNOSIS:  1. Right knee torn lateral meniscus, plica syndrome and arthritis  2. Left knee torn lateral meniscus and arthritis    OPERATION:  1. Right knee arthroscopy, excision of plica, debridement of medial  femoral condyle and patellofemoral joint and lateral meniscectomy  2. Left knee arthroscopy, debridement of patellofemoral joint and  partial lateral meniscectomy    DESCRIPTION OF OPERATIVE PROCEDURE:  The patient was taken to the Operating Theatre and given a General  anesthetic with oral tracheal intubation. Both knees were prepped  and draped in the usual fashion. The pneumatic tourniquet was  employed and inflated to 350 mmHg pressure on the right side first.  The scope, irrigation system and probe were introduced into the knee  in the routine fashion.  On examination, there was a firm fibrous  plica in the medial compartment removed with the radial resector.  There was a tear of the anterior horn of the lateral meniscus from 6  o'clock to 10 o'clock, representing 40% of the width of the meniscus  on the undersurface.  The torn portion was removed and the remaining  meniscus was carefully contoured and felt to be stable.   There were  degenerative changes of the lateral tibial plateau, medial femoral  condyle and patellofemoral joint.  The fibular cartilage was taken  down with a radial resector to a stable rim.  There was no bare  bone.   The medial meniscus was normal.  The anterior cruciate was  normal.  The scope, irrigation system and probe were removed from  the knee in the routine fashion. The wounds were closed with  interrupted 3-0  Nylon. A compression dressing was then placed on the  knee. The tourniquet was let down.  Tourniquet time was 10 minutes.    Attention was then directed to the left knee. The pneumatic  tourniquet was employed and inflated to 350 mmHg pressure.  The  scope, irrigation system and probe were introduced into the knee in  the routine fashion.  On examination, the medial compartment was  normal.  The anterior cruciate was normal.  The lateral meniscus  showed a tear from the 2 o'clock to 6 o'clock position, representing  45% of the width of the meniscus on the undersurface of the  meniscus.  The torn portion was removed and the remaining meniscus  was carefully contoured and felt to be stable.   There were  degenerative changes of the lateral tibial plateau.  The fibular  cartilage was taken down with a radial resector to a stable rim.  There was no bare bone.   There were degenerative changes on the  undersurface of the patella.  Fibular cartilage was taken down with  radial resector to a stable rim.  There was no bare bone.  The  remainder of the knee examination was normal.  The scope,   irrigation  system and probe were removed from the knee in the routine fashion.  The wounds were closed with interrupted 3-0 Nylon.  A compression  dressing was then placed on the knee. The tourniquet was let down.  Tourniquet time was 10 minutes.   The patient was awakened and taken  to the Recovery Room in satisfactory condition.                _____________________________  Avaneesh Pepitone. Robert Keesha Pellum, M.D.    TRL:klg DD: 08/21/2011 10:54:44  DT: 08/21/2011 13:42:50 Job ID:  [id number]  CC:      Document #:  [id number]

## 2011-08-21 NOTE — Brief Op Note (Signed)
BRIEF OPERATIVE NOTE    Date of Procedure: 08/21/2011   Preoperative Diagnosis: TORN MEDIAL MENISCUS BOTH KNEES  Postoperative Diagnosis: right knee torn lateral meniscus & plica Left knee torn lateral meniscus    Procedure: Procedure(s):  BILATERAL KNEE ARTHROSCOPY Bilateral knee lateral meniscetomy & debridement  Surgeon(s) and Role:     * Geisha Abernathy Lendell Caprice, MD - Primary  Anesthesia: General   Estimated Blood Loss: 0  Specimens:   ID Type Source Tests Collected by Time Destination   1 : bilateral knee shavings Preservative Knee   Romolo Sieling Lendell Caprice, MD 08/21/2011 1041 Pathology      Findings: 0   Complications: 0  Implants: * No implants in log *

## 2011-09-15 ENCOUNTER — Encounter

## 2011-09-15 NOTE — Progress Notes (Signed)
MRI CERVICAL SPINE COMPLETED 12:08 PM  Jesse Montoya

## 2011-11-05 ENCOUNTER — Encounter

## 2011-11-05 NOTE — Progress Notes (Signed)
Mri right knee completed @ 1145. SHEILA M BLEVINS

## 2012-07-02 LAB — AMB POC URINALYSIS DIP STICK AUTO W/O MICRO
Glucose (UA POC): NEGATIVE
Ketones (UA POC): NEGATIVE
Leukocyte esterase (UA POC): NEGATIVE
Nitrites (UA POC): NEGATIVE
Protein (UA POC): NEGATIVE mg/dL
Specific gravity (UA POC): 1.005 (ref 1.001–1.035)
pH (UA POC): 6.5 (ref 4.6–8.0)

## 2012-07-02 LAB — AMB POC CREATININE ASSAY, OTHER SOURCE
Creatinine, other source: 50
Source: NORMAL

## 2012-07-02 NOTE — Progress Notes (Signed)
Male Progress Note      Patient: Jesse Montoya               Sex: male             MRN: [id number]      Date of Birth:  January 18, 1961      Age:  52 y.o.               Subjective:     Jesse Montoya is a 52 y.o. male who is being seen for follow-up for microscopic hematuria, dysuria, hematospermia, and renal cyst.  We last saw the patient in 12/12 for these problems.  He had a renal ultrasound which confirmed a small, simple renal cyst.  He had a cystoscopy on 01/16/10, which was negative for signs of malignancy.  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  He states that since then, he has been having hesitancy with straining to urinate, weak stream, intermittency.  He does have nocturia 1 time nightly.  He does feel that his bladder completely empties when he urinates.  He is also having dysuria, but hasn't been checked for a UTI.  The patient states that he is having left kidney pain that goes down his back.  He has a history of back problems, but states this isn't his normal pain.  It has been going on for "a while."  He is drinking about 24 - 36 bottles of water daily.  The hematospermia has resolved since his last visit.  He has an umbilical hernia and wants to have this addressed by a general surgeon.    The patient is also having sexual difficulties.  He has never taken any medications to help with this.  Directions for dosing as well as possible side effects were discussed with the patient to include headache, nasal congestion, and priapism.  30 minutes were spent in face-to-face time with the patient at his visit today, >50% of which were spent in counseling in treatment options and outcomes, diagnostic testing, and answering all questions  .     Past Medical History   Diagnosis Date   ??? Hypertension    ??? Asthma      bronchitis   ??? Gastrointestinal disorder      acid reflux   ??? 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 Arthritis    ??? Hyperlipidemia    ??? Other  ill-defined conditions      Hernia x 3 near umbilical area, bladder problems;  Glaucoma   ??? Diabetes      Borderline Diabetic   ??? GERD (gastroesophageal reflux disease)    ??? Chronic pain      neck and back       Past Surgical History   Procedure Laterality Date   ??? Cystoscopy  12/2009   ??? Hx other surgical       hernia   ??? Pr abdomen surgery proc unlisted       Hernia repair x2 inguinal   ??? Hx urological       Cystoscopy   ??? Hx orthopaedic       right shoulder   ??? Hx orthopaedic  07/2011     bilateral knee       Family History   Problem Relation Age of Onset   ??? Cancer Mother    ??? Lung Disease Mother    ??? Hypertension Mother    ???  Stroke Father    ??? Diabetes Father    ??? Hypertension Father    ??? Other Other      Family hx of brain tumors   ??? Heart Disease Other    ??? Diabetes Other        History     Social History   ??? Marital Status: SINGLE     Spouse Name: N/A     Number of Children: N/A   ??? Years of Education: N/A     Social History Main Topics   ??? Smoking status: Current Every Day Smoker -- 1.00 packs/day for 24 years   ??? Smokeless tobacco: Never Used   ??? Alcohol Use: No      Comment: seldom 12 pk per month   ??? Drug Use: No   ??? Sexually Active: Yes -- Male partner(s)     Birth Control/ Protection: None     Other Topics Concern   ??? Not on file     Social History Narrative   ??? No narrative on file       Prior to Admission medications    Medication Sig Start Date End Date Taking? Authorizing Provider   oxyCODONE IR (OXY-IR) 15 mg immediate release tablet Take 15 mg by mouth every four (4) hours as needed for Pain.   Yes Historical Provider   omeprazole (PRILOSEC) 40 mg capsule Take 40 mg by mouth daily.   Yes Historical Provider   lidocaine (LIDODERM) 5 %(700 mg/patch) 1 Patch by TransDERmal route every twelve (12) hours. 3 patches   Yes Historical Provider   zolpidem (AMBIEN) 5 mg tablet Take  by mouth nightly as needed.     Yes Historical Provider   TIZANIDINE HCL (ZANAFLEX PO) Take 1 Tab by mouth two (2) times a  day. 4 mg     Yes Phys Other, MD   gabapentin (NEURONTIN) 300 mg capsule Take 600 mg by mouth four (4) times daily.   Yes Phys Other, MD   lisinopril (PRINIVIL, ZESTRIL) 20 mg tablet Take 40 mg by mouth daily.   Yes Phys Other, MD       Allergies   Allergen Reactions   ??? Darvocet A500 (Propoxyphene N-Acetaminophen) Rash and Nausea Only   ??? Other Medication Rash     Pt states steroid shot causes rash pills are OK   ??? Robaxin (Methocarbamol) Rash   ??? Toradol (Ketorolac) Hives   ??? Tramadol Rash       Review of Systems:  General:  no weight loss, fever, night sweats  Head:   seasonal allergies  Eyes:   negative  Respiratory:  shortness of breath  Cardiac:  negative  Gatrointestinal: negative  Urinary:  dysuria and urinary frequency/urgency  Musculoskeletal: back pain  Endocrine:  Denies: polydipsia/polyuria  Hematological: there is no easy bleeding or bruising  Psychological: negative  Skin:   Denies: rash, itching, hives      Objective:      Visit Vitals   Item Reading   ??? BP 127/82   ??? Pulse 84   ??? Temp 98 ??F (36.7 ??C)   ??? Resp 18   ??? Ht 6' (1.829 m)   ??? Wt 220 lb (99.791 kg)   ??? BMI 29.83 kg/m2         Labs:  Urinalysis:    Results for orders placed in visit on 07/02/12   AMB POC URINALYSIS DIP STICK AUTO W/O MICRO     Status: None  Result Value Range Status    Color (UA POC) Yellow   Final    Clarity (UA POC) Clear   Final    Glucose (UA POC) Negative  Negative Final    Bilirubin (UA POC)    Negative     Ketones (UA POC) Negative  Negative Final    Specific gravity (UA POC) 1.005  1.001 - 1.035 Final    Blood (UA POC) Trace  Negative Final    pH (UA POC) 6.5  4.6 - 8.0 Final    Protein (UA POC) Negative  Negative mg/dL Final    Urobilinogen (UA POC)    0.2 - 1     Nitrites (UA POC) Negative  Negative Final    Leukocyte esterase (UA POC) Negative  Negative Final         PSA: 11/2010: 1.2        FINAL CYTOLOGIC INTERPRETATION:  Voided urine, cytology:  No diagnostic evidence of malignancy identified         Imaging:   radiology: Ultrasound:   REPORT:  BILATERAL ULTRASOUND OF THE KIDNEYS-COMPLETE    INDICATION: Left renal cyst, left flank pain.    FINDINGS:    The right kidney measures 11.5 x 5.3 x 5.1 cm in size. The left kidney   measures 11.8 x 5.6 x 4.9 cm in size. There is a well-circumscribed cyst   about the left kidney measuring 1.5 cm in size. Findings consistent with a   simple cyst. The remainder of the renal parenchyma is homogeneous. No   hydronephrosis or nephrolithiasis. The bladder is only mildly distended   limiting its assessment.      IMPRESSION: NO ACUTE PROCESS. SIMPLE LEFT RENAL CYST.           PVR:  63 mL    SHIM Score: 12 (mild to moderate ED)    AUA/QOL score:  25/6      Physical Exam:   Constitutional: Normal appearing patient with well groomed; No deformities; Body habitus appears normal; Nutritionally sound.   Neck: Supple; No JVD; No cervical lymphadenopathy; No carotid bruits; No masses; trachea midline; No thyromegaly  Respiratory: No use of accessory muscles; No intercostal retractions; Clear to auscultation bilaterally  Heart: Regular rate and rhythm; No murmurs, rubs, gallops  GI: soft, non tender, normal bowel sounds, no palpable masses, no organomegaly; umbilical hernia    GU:   Anus and perineum without any focal skin defects     Penis is uncircumcised without plaques, masses, scaring, or deformity    Urethral meatus is normal in size and location; No urethral lesions, discharge, or masses    Testis are descended bilaterally within the scrotum; Testis are symmetric and normal size; No masses.    Epididymides are noted bilaterally; They are normal in size and symmetry; No cyst or masses     Scrotum is without any lesions, cysts, or rashes    No inguinal lymphadenopathy and right inguinal tenderness    Digital Rectal Exam:   Prostate is 1+ (30gms); Rectal sphincter tone is good: No hemorrhoids present; No rectal masses    Seminal Vesicles are normal in size, symmetry without  tenderness, mass, or enlargement.    Musculoskeletal:   No CVA tenderness; Good sensation, ROM, and strength bilaterally of the Upper and Lower extremities.    Assessment/Plan     1. Microscopic hematuria - Will send urine for UA and culture.  We may schedule a more formal work-up pending results.   2.  Left renal cyst, simple - No further imaging required.   3. Hematospermia--resolved  4. Dysuria - Patient was advised to increase water intake to at least 64 oz daily and decrease coffee, colas, teas, etc.   5. ED - Samples of Levitra 20 mg (Lot [id number] Exp Feb 16) and Staxyn 10 mg (Lot [id number] Exp Sept 14) were given to the patient.  6. BPH with LUTS - add tamsulosin 0.4 mg daily.  Rx sent to the pharmacy.    7. Incomplete bladder emptying - add tamsulosin 0.4 mg daily.  Possible side effects of this medication were discussed with the patient to include orthostasis and retrograde ejaculation.  The patient was advised to take this medication at least 2 hours apart from the PDE5 inhibitors.  8. We will follow-up with the patient in one year.

## 2012-07-02 NOTE — Patient Instructions (Addendum)
[facility]     Office working hours are 8:00am-4:30pm    Office phone number is [phone] and the fax line is [phone]  For non-emergent medical care and clinical advice during office hours  1. Call the office or  2. Send a message to office staff via mychart  Emergency care can be obtained at the [facility] ER, Urgent Care or 911.    Patient Satisfaction Survey   We appreciate you sharing your e-mail address with us. Please watch for our patient  satisfaction survey which you will receive by e-mail. We strive to provide you with the   best care possible. We respect all comments and will take them into consideration to improve our service.  Thank you for your participation.      Blood in the Urine: After Your Visit  Your Care Instructions  Blood in the urine, or hematuria, may make the urine look red, brown, or pink. There may be blood every time you urinate or just from time to time. You cannot always see blood in the urine, but it will show up in a urine test.  Blood in the urine may be serious. It should always be checked by a doctor. Your doctor may recommend more tests, including an X-ray, a CT scan, or a cystoscopy (which lets a doctor look inside the urethra and bladder).  Blood in the urine can be a sign of another problem. Common causes are bladder infections and kidney stones. An injury to your groin or your genital area can also cause bleeding in the urinary tract. Very hard exercise???such as running a marathon???can cause blood in the urine. Blood in the urine can also be a sign of kidney disease or cancer in the bladder or kidney. Many cases of blood in the urine are caused by a harmless condition that runs in families. This is called benign familial hematuria. It does not need any treatment.  Sometimes your urine may look red or brown even though it does not contain blood. For example, not getting enough fluids (dehydration), taking certain medicines, or having a liver problem can  change the color of your urine. Eating foods such as beets, rhubarb, or blackberries or foods with red food coloring can make your urine look red or pink.  Follow-up care is a key part of your treatment and safety. Be sure to make and go to all appointments, and call your doctor if you are having problems. It's also a good idea to know your test results and keep a list of the medicines you take.  When should you call for help?  Call your doctor now or seek immediate medical care if:  ?? You have symptoms of a urinary infection. For example:  ?? You have pus in your urine.  ?? You have pain in your back just below your rib cage. This is called flank pain.  ?? You have a fever, chills, or body aches.  ?? It hurts to urinate.  ?? You have groin or belly pain.  ?? You have more blood in your urine.  Watch closely for changes in your health, and be sure to contact your doctor if:  ?? You have new urination problems.  ?? You do not get better as expected.   Where can you learn more?   Go to http://www.healthwise.net/[facility]  Enter N871 in the search box to learn more about "Blood in the Urine: After Your Visit."   ?? 2006-2014 [employer]. Care instructions adapted   under license by [employer] (which disclaims liability or warranty for this information). This care instruction is for use with your licensed healthcare professional. If you have questions about a medical condition or this instruction, always ask your healthcare professional. Health[employer], Incorporated disclaims any warranty or liability for your use of this information.  Content Version: 10.0.270728; Last Revised: Jul 11, 2011          Benign Prostatic Hyperplasia: After Your Visit  Your Care Instructions  Benign prostatic hyperplasia, or BPH, is an enlarged prostate gland. The prostate is a small gland that makes some of the fluid in semen. Prostate enlargement happens to almost all men as they age. It is usually not serious. BPH does not cause prostate  cancer.  As the prostate gets bigger, it may partly block the flow of urine. You may have a hard time getting a urine stream started or completely stopped. BPH can cause dribbling. You may have a weak urine stream, or you may have to urinate more often than you used to, especially at night. Most men find these problems easy to manage.  You do not need treatment unless your symptoms bother you a lot or you have other problems, such as bladder infections or stones. In these cases, medicines may help. Surgery is not needed unless the urine flow is blocked or the symptoms do not get better with medicine.  Follow-up care is a key part of your treatment and safety. Be sure to make and go to all appointments, and call your doctor if you are having problems. It's also a good idea to know your test results and keep a list of the medicines you take.  How can you care for yourself at home?  ?? Take plenty of time to urinate. Try to relax.  ?? Try "double voiding." Urinate as much you can, relax for a few moments, and then try to urinate again.  ?? Sit on the toilet to urinate.  ?? Read or think of other things while you are waiting.  ?? Turn on a faucet, or try to picture running water. Some men find that this helps get their urine flowing.  ?? If dribbling is a problem, wash your penis daily to avoid skin irritation and infection.  ?? Avoid caffeine and alcohol. These drinks will increase how often you need to urinate. Spread your fluid intake throughout the day. If the urge to urinate often wakes you at night, limit your fluid intake in the evening. Urinate right before you go to bed.  ?? Many over-the-counter cold and allergy medicines can make the symptoms of BPH worse. Avoid antihistamines, decongestants, and allergy pills, if you can. Read the warnings on the package.  ?? If you take any prescription medicines, especially tranquilizers or antidepressants, ask your doctor or pharmacist whether they can cause urination problems.  There may be other medicines you can use that do not cause urinary problems.  ?? Take your medicines exactly as prescribed. Call your doctor if you think you are having a problem with your medicine.  ?? Herbal products such as saw palmetto help some men with BPH. But do not take any herbal product without first talking to your doctor.  When should you call for help?  Call your doctor now or seek immediate medical care if:  ?? You cannot urinate at all.  ?? You have symptoms of a urinary infection. For example:  ?? You have blood or pus in your urine.  ?? You  have pain in your back just below your rib cage. This is called flank pain.  ?? You have a fever, chills, or body aches.  ?? It hurts to urinate.  ?? You have groin or belly pain.  Watch closely for changes in your health, and be sure to contact your doctor if:  ?? It hurts when you ejaculate.  ?? Your urinary problems get a lot worse or bother you a lot.   Where can you learn more?   Go to [employer]  Enter [id number] in the search box to learn more about "Benign Prostatic Hyperplasia: After Your Visit."   ?? 2006-2014 Healthwise, [employer]. Care instructions adapted under license by [employer] (which disclaims liability or warranty for this information). This care instruction is for use with your licensed healthcare professional. If you have questions about a medical condition or this instruction, always ask your healthcare professional. [employer]wise, Inc[employer] disclaims any warranty or liability for your use of this information.  Content Version: 10.0.270728; Last Revised: Jul 11, 2011

## 2012-07-03 LAB — URINALYSIS W/MICROSCOPIC
Bilirubin: NEGATIVE
Blood: NEGATIVE
Glucose: NEGATIVE mg/dL
Ketone: NEGATIVE mg/dL
Leukocyte Esterase: NEGATIVE
Nitrites: NEGATIVE
Protein: NEGATIVE mg/dL
Specific gravity: <1.005 (ref 1.002–1.030)
Urobilinogen: 0.2 EU/dL (ref 0–1)
pH (UA): 6.5 (ref 4.5–8.0)

## 2012-07-03 LAB — PSA SCREENING (SCREENING): Prostate Specific Ag: 1.2 ng/mL (ref 0.0–4.0)

## 2012-07-04 LAB — CULTURE, URINE
Culture result:: NO GROWTH
Culture: NO GROWTH

## 2012-07-28 MED ADMIN — ioversol (OPTIRAY) 350 mg iodine/mL contrast injection 75 mL: INTRAVENOUS | @ 14:00:00 | NDC 00019133395

## 2012-07-28 MED FILL — OPTIRAY 350 MG IODINE/ML INTRAVENOUS SYRINGE: 350 mg iodine/mL | INTRAVENOUS | Qty: 75

## 2012-07-28 NOTE — Progress Notes (Signed)
CT UROGRAM COMPLETED 10:08 AM

## 2012-08-31 NOTE — Progress Notes (Signed)
Sharlot Gowda, M.D., F.A.C.S.  GENERAL SURGERY  [address]  [facility] [address]  [phone]  FAX: [phone]          OFFICE EVALUATION      Defade, Art Buff, DO      Patient: Jesse Montoya               Sex: male             MRN: [id number]     Date of Birth:  05-02-1960      Age:  52 y.o.              Subjective:     Jesse Montoya is a 52 y.o. male who presents with multiple complaints. He thinks he has a hernia in the right mid abdomen, just lateral and inferior to his umbilicus. He complained of some soreness in this region. His past history is significant for bilateral laparoscopic inguinal hernia repair at an outlying facility.    Patient also suffers from chronic pain, back injury, spinal stenosis. He has bilateral leg pain, and some numbness in his left lateral in the region.    He describes previous episodes of constipation, really postal softeners, he has had a colonoscopy by Dr. Timmothy Sours.    Additional complaints include enlarged tender nodules in his axilla. He denies drainage. He denies palpable breast mass or changing skin lesions. He denies fever chills or night sweats.    Patient admits exposure to cats, and "I am allergic to mosquito bites."    He denies chronic cough. He denies intraoral lesions, but admits to a swollen skin lesion, at the right angle of the mandible.    Review of Systems  A comprehensive review of systems was negative except for that written in the History of Present Illness.         Past Medical History   Diagnosis Date   ??? Hypertension    ??? Asthma      bronchitis   ??? Gastrointestinal disorder      acid reflux   ??? 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 Arthritis    ??? Hyperlipidemia    ??? Other ill-defined conditions      Hernia x 3 near umbilical area, bladder problems;  Glaucoma   ??? Diabetes      Borderline Diabetic   ??? GERD (gastroesophageal reflux disease)    ??? Chronic pain      neck and back       Past  Surgical History   Procedure Laterality Date   ??? Cystoscopy  12/2009   ??? Hx other surgical       hernia   ??? Pr abdomen surgery proc unlisted       Hernia repair x2 inguinal   ??? Hx urological       Cystoscopy   ??? Hx orthopaedic       right shoulder   ??? Hx orthopaedic  07/2011     bilateral knee       Family History   Problem Relation Age of Onset   ??? Cancer Mother    ??? Lung Disease Mother    ??? Hypertension Mother    ??? Stroke Father    ??? Diabetes Father    ??? Hypertension Father    ??? Other Other      Family hx of brain tumors   ??? Heart Disease  Other    ??? Diabetes Other    ??? Arthritis-osteo Sister    ??? Cancer Sister    ??? Diabetes Sister    ??? Hypertension Sister    ??? Diabetes Brother    ??? Hypertension Brother        History     Social History   ??? Marital Status: SINGLE     Spouse Name: N/A     Number of Children: N/A   ??? Years of Education: N/A     Social History Main Topics   ??? Smoking status: Current Every Day Smoker -- 1.00 packs/day for 24 years   ??? Smokeless tobacco: Never Used   ??? Alcohol Use: No      Comment: seldom 12 pk per month   ??? Drug Use: No   ??? Sexually Active: Yes -- Male partner(s)     Birth Control/ Protection: None     Other Topics Concern   ??? Not on file     Social History Narrative   ??? No narrative on file       Current Outpatient Prescriptions   Medication Sig Dispense Refill   ??? aspirin delayed-release 81 mg tablet Take  by mouth daily.       ??? oxyCODONE IR (OXY-IR) 15 mg immediate release tablet Take 15 mg by mouth every four (4) hours as needed for Pain.       ??? omeprazole (PRILOSEC) 40 mg capsule Take 40 mg by mouth daily.       ??? lidocaine (LIDODERM) 5 %(700 mg/patch) 1 Patch by TransDERmal route every twelve (12) hours. 3 patches       ??? zolpidem (AMBIEN) 5 mg tablet Take  by mouth nightly as needed.         ??? TIZANIDINE HCL (ZANAFLEX PO) Take 1 Tab by mouth two (2) times a day. 4 mg         ??? gabapentin (NEURONTIN) 300 mg capsule Take 600 mg by mouth four (4) times daily.       ??? lisinopril  (PRINIVIL, ZESTRIL) 20 mg tablet Take 40 mg by mouth daily.       ??? vardenafil (LEVITRA) 20 mg tablet Take 20 mg by mouth as needed for 2 doses.  2 Tab  0         Allergies   Allergen Reactions   ??? Darvocet A500 (Propoxyphene N-Acetaminophen) Rash and Nausea Only   ??? Other Medication Rash     Pt states steroid shot causes rash pills are OK   ??? Robaxin (Methocarbamol) Rash   ??? Toradol (Ketorolac) Hives   ??? Tramadol Rash           Lab Results   Component Value Date/Time    WBC 5.6 02/22/2010  8:00 PM    HGB 15.2 07/31/2011 11:18 AM    HCT 44.2 02/22/2010  8:00 PM    PLATELET 151 02/22/2010  8:00 PM    MCV 85.8 02/22/2010  8:00 PM       No results found for this basename: HBA1C,  HGBE8     Lab Results   Component Value Date/Time    Sodium 140 07/31/2011 11:18 AM    Potassium 4.0 07/31/2011 11:18 AM    Chloride 106 07/31/2011 11:18 AM    CO2 28 07/31/2011 11:18 AM    Anion gap 6 07/31/2011 11:18 AM    Glucose 120 02/22/2010  8:00 PM    BUN 10 02/22/2010  8:00 PM  Creatinine 1.00 07/31/2011 11:18 AM    BUN/Creatinine ratio 11 02/22/2010  8:00 PM    GFR est non-AA >60 02/22/2010  8:00 PM    Calcium 8.4 02/22/2010  8:00 PM    GFR est AA >60 02/22/2010  8:00 PM       Lab Results   Component Value Date/Time    Sodium 140 07/31/2011 11:18 AM    Potassium 4.0 07/31/2011 11:18 AM    Chloride 106 07/31/2011 11:18 AM    CO2 28 07/31/2011 11:18 AM    Anion gap 6 07/31/2011 11:18 AM    Glucose 120 02/22/2010  8:00 PM    BUN 10 02/22/2010  8:00 PM    Creatinine 1.00 07/31/2011 11:18 AM    BUN/Creatinine ratio 11 02/22/2010  8:00 PM    GFR est AA >60 02/22/2010  8:00 PM    GFR est non-AA >60 02/22/2010  8:00 PM    Calcium 8.4 02/22/2010  8:00 PM    Bilirubin, total 0.4 02/22/2010  8:00 PM    AST 21 02/22/2010  8:00 PM    Alk. phosphatase 61 02/22/2010  8:00 PM    Protein, total 6.9 02/22/2010  8:00 PM    Albumin 4.0 02/22/2010  8:00 PM    Globulin 2.9 02/22/2010  8:00 PM    A-G Ratio 1.4 02/22/2010  8:00 PM    ALT 46 02/22/2010  8:00 PM       Lab Results    Component Value Date/Time    Amylase 45 02/22/2010  8:00 PM     Lab Results   Component Value Date/Time    Lipase 130 02/22/2010  8:00 PM       Objective:      Visit Vitals   Item Reading   ??? BP 138/76   ??? Pulse 65   ??? Temp(Src) 96.9 ??F (36.1 ??C) (Oral)   ??? Resp 16   ??? Wt 215 lb (97.523 kg)   ??? BMI 29.15 kg/m2   ??? SpO2 98%       Physical Exam:  General:  Alert, cooperative, well nourished, well developed, appears stated age   Eyes:  Sclera anicteric.    Mouth/Throat: Mucous membranes normal, oral pharynx clear, no palpable lesions, resolving folliculitis angle of right mandible   Neck: Supple, without adenopathy   Lungs:   Clear to auscultation bilaterally, good effort   CV:  Regular rate and rhythm,no murmur, click, rub or gallop   Abdomen:   Soft, non-tender. bowel sounds active. non-distended   Extremities: No cyanosis or edema, muscular     Skin: Patient is darkly tanned, with multiple scars on the skin, solar damage to the upper back, there's a small scaling lesion on the right forearm measuring 5 mm, no abnormal pigmented lesions are appreciated on the hands, thorax, or upper extremities     Lymph nodes: Cervical ,supraclavicular, inguinal negative for adenopathy    Tender palpable lymph nodes bilateral axilla, these are in the typical location, deep, in addition just below the skin, or possibly intradermal bilaterally there is a 1 cm x 5 mm tender nodule      Musculoskeletal: No swelling or deformity   :     Psych: Alert and oriented, normal mood affect given the setting      Breast exam: no dominant mass, tissue density normal for patients age         Data Review images and reports reviewed    Assessment/Plan  Patient Active Problem List   Diagnosis Code   ??? Abdominal  pain, other specified site 789.09   ??? Hematospermia 608.82   ??? Urinary urgency 788.63   ??? Nocturia 788.43   ??? Tobacco abuse 305.1   ??? 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 Axillary adenopathy 785.6       Encounter Diagnoses   Name  Primary?   ??? Axillary adenopathy Yes   ??? Skin lesion        This patient has many issues. I reviewed the CT scan, and the transverse colon is visible posterior to that region without evidence of herniation. He likely suffers from chronic constipation with chronic narcotic requirement. I will review his latest colonoscopy to make sure that he is up-to-date. Continue stool softener.    In regards to the axillary adenopathy, I will treat him with prophylactic antibiotics for cat scratch, and visualized with ultrasound of possible FNA.    I also recommend biopsy the right forearm skin lesion, this will be addressed upon follow-up      We also discussed smoking cessation, and the effects of smoking on hernia formation.    We also discussed increased risk for skin cancer with excessive sun exposure.    I will see him back in two weeks pending the altar sound with FNA, consider excision of the superficial structures pending review of those results

## 2012-09-03 ENCOUNTER — Encounter

## 2012-09-16 NOTE — Progress Notes (Signed)
Peri-Operative Education                Taught To                Understanding   Content of Teaching                        Pt[x] Other[]             Complete [x]   Partial []  No []     PRE: Ultrasound Guided Axillary Lymph Node    POST:                                                                                                                                      Nursing Diagnosis:  1).Alteration comfort due to invasive procedure and fear of the unknown.    Expected outcome/Evaluation  Pt will experience diminished discomfort  [x]  YES    []  NO    Pt experiences comfort  Comments:     Nursing Intervention   1. Position for comfort as indicated & document    2. Reassure patient whenever necessary & document  3. Administer pain medication as ordered & document response.  4. Pain assessment/Reassessment    Nursing Diagnosis:  2.) Knowledge deficit: Need for Pre-and Post-Procedure teaching    Expected outcome/Evaluation:  Pt will verbalize basic understanding of events and procedure   [x]  YES   []   NO  Comments:    Nursing Intervention   1. Explain procedure in full, prior to implementation of procedure.  2. Provide written instructions on post-procedure follow up   (i.e. Remain NPO x 1 hr after receiving Mod Sedation, call MD for Lab results or appointments).  3. Reinforce Physician's instructions (i.e. NPO ect.).

## 2012-09-16 NOTE — Progress Notes (Signed)
Dr. Woolley reviewed ultrasound, biopsy cancelled.  Will dictate a report.

## 2012-09-16 NOTE — Progress Notes (Signed)
.  [place] of RT AXILLA completed 1:53 PM by Jasean Ambrosia A Hawkin Charo, RDMS  ..[place] of LT AXILLA completed 1:53 PM by Toriana Sponsel A Giannamarie Paulus, RDMS

## 2012-09-20 NOTE — Telephone Encounter (Signed)
Left message the importance of follow up with DR. Yetta Barre

## 2012-09-20 NOTE — Progress Notes (Signed)
Quick Note:    Patient will be notifed, of ultrasound results.      ______

## 2012-11-10 NOTE — Progress Notes (Signed)
Jesse Montoya, M.D., F.A.C.S.  GENERAL SURGERY  [address]  [facility] [place] [address]  [phone]  FAX: [phone]            No ref. provider found  Referring Provider:No referring provider defined for this encounter.  PCP: Arlana Lindau, MD      Chief Complaint:  Chief Complaint   Patient presents with   ??? Follow-up     follow up from office visit 08/31/12         Procedure:    Ultrasound of the axilla      Subjective:     Patient has complaints of asymmetry of the right lower abdominal wall.  In addition some chronic back pain       Review of Systems  A comprehensive review of systems was negative except for that written in the History of Present Illness.    Objective:     Visit Vitals   Item Reading   ??? BP 128/76   ??? Pulse 82   ??? Resp 16   ??? Wt 214 lb (97.07 kg)   ??? BMI 29.02 kg/m2   ??? SpO2 98%                Physical Exam:     General appearance - alert, well appearing, and in no distress  Chest - clear to auscultation, no wheezes, rales or rhonchi, symmetric air entry  Heart - normal rate, regular rhythm, normal S1, S2, no murmurs, rubs, clicks or gallops  Abdomen - soft, nontender, nondistended, no masses or organomegaly  Extremities - no pedal edema noted    The previously tender lymph nodes in the bilateral axilla, are no longer tender, no mass palpable    Cervical, supra clavicular, inguinal negative for lymphadenopathy       Labs: No results found for this or any previous visit (from the past 24 hour(s)).    Meds:   Current Outpatient Prescriptions   Medication Sig Dispense Refill   ??? aspirin delayed-release 81 mg tablet Take  by mouth daily.       ??? oxyCODONE IR (OXY-IR) 15 mg immediate release tablet Take 15 mg by mouth every four (4) hours as needed for Pain.       ??? omeprazole (PRILOSEC) 40 mg capsule Take 40 mg by mouth daily.       ??? lidocaine (LIDODERM) 5 %(700 mg/patch) 1 Patch by TransDERmal route every twelve (12) hours. 3  patches       ??? zolpidem (AMBIEN) 5 mg tablet Take  by mouth nightly as needed.         ??? TIZANIDINE HCL (ZANAFLEX PO) Take 1 Tab by mouth two (2) times a day. 4 mg         ??? gabapentin (NEURONTIN) 300 mg capsule Take 600 mg by mouth four (4) times daily.       ??? lisinopril (PRINIVIL, ZESTRIL) 20 mg tablet Take 40 mg by mouth daily.       ??? azithromycin (ZITHROMAX) 250 mg tablet Take two tablets today then one tablet daily  6 Tab  0   ??? vardenafil (LEVITRA) 20 mg tablet Take 20 mg by mouth as needed for 2 doses.  2 Tab  0         Data Review images and reports reviewed    Assessment:     Patient Active Problem List   Diagnosis Code   ??? Abdominal  pain, other specified site 789.09   ??? Hematospermia 608.82   ??? Urinary urgency 788.63   ??? Nocturia 788.43   ??? Tobacco abuse 305.1   ??? 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 Axillary adenopathy 785.6       No diagnosis found.      Plan/Recommendations/Medical Decision Making:     I discussed with the patient the lack of pathologic findings on the ultrasound. These lymph nodes are physiologic, and palpable in a thinner patient.  The previously noted skin lesion has resolved, he has some minor abrasions to his forearms, no pigmented lesions.     In addition the previous CT scan performed on June 4 was reviewed, there is no hernia in the lower abdomen, suggest treating stool quality. Follow-up with PCP.

## 2013-01-22 NOTE — Progress Notes (Signed)
HISTORY OF PRESENT ILLNESS  Jesse Montoya is a 52 y.o. male.  Hand Swelling   The history is provided by the patient. This is a new problem. The current episode started more than 1 week ago. The problem occurs constantly. The problem has been gradually worsening. The pain is present in the right hand. The pain is at a severity of 9/10. The pain is moderate. Associated symptoms include back pain (no new). Pertinent negatives include no tingling and no neck pain. The symptoms are aggravated by movement. He has tried cold, rest and heat for the symptoms.   right hand started swelling a little over a month ago, went away, but has returned.  Admits to numbness and tingling in the hand, with intermittent burning sensation and pain shooting up the right arm.    States that he slipped and fell at home in the bath tub.  Fell onto right side after slipping on soap.  States that he has re-aggrevated his back and right hip.      Review of Systems   Constitutional: Negative for fever, chills and malaise/fatigue.   HENT: Negative for neck pain.    Gastrointestinal: Negative for nausea, vomiting and abdominal pain.   Musculoskeletal: Positive for back pain (no new), joint pain and falls.   Neurological: Negative for tingling, sensory change and focal weakness.       BP 114/88   Pulse 103   Temp(Src) 97.5 ??F (36.4 ??C) (Tympanic)   Resp 24   Ht 6' (1.829 m)   Wt 221 lb 6.4 oz (100.426 kg)   BMI 30.02 kg/m2   SpO2 99%  Physical Exam   Nursing note and vitals reviewed.  Constitutional: He appears well-developed and well-nourished.   HENT:   Head: Normocephalic.   Eyes: No scleral icterus.   Cardiovascular: Normal rate, regular rhythm and normal heart sounds.    Pulmonary/Chest: Effort normal and breath sounds normal.   Musculoskeletal: He exhibits tenderness. He exhibits no edema.        Right hand: He exhibits decreased range of motion, tenderness, bony tenderness and swelling. He exhibits no deformity. Normal sensation noted.  Decreased strength noted.        Hands:  Neurological: He is alert. He exhibits normal muscle tone.   Skin: Skin is warm and dry.     No acute fracture on imaging.  ASSESSMENT and PLAN  Encounter Diagnoses   Name Primary?   ??? Right hand pain Yes   ??? Swelling of joint of right hand    ??? Hand arthritis      Orders Placed This Encounter   ??? XR HAND RT MIN 3 V   ??? predniSONE (DELTASONE) 10 mg tablet   ??? indomethacin (INDOCIN) 50 mg capsule     Follow-up Disposition:  Return if symptoms worsen or fail to improve.

## 2013-01-22 NOTE — Patient Instructions (Signed)
Fluids  Rest  Motrin or tylenol prn fever or pain  Should symptoms not improve or should they worsen, notify Primary Care Provider and/or go to ED  Take Rx as prescribed and until gone    Hand Pain: After Your Visit  Your Care Instructions  Common causes of hand pain are overuse and injuries, such as might happen during sports or home repair projects. Everyday wear and tear, especially as you get older, also can cause hand pain.  Most minor hand injuries will heal on their own, and home treatment is usually all you need to do. If you have sudden and severe pain, you may need tests and treatment.  Follow-up care is a key part of your treatment and safety. Be sure to make and go to all appointments, and call your doctor if you are having problems. It???s also a good idea to know your test results and keep a list of the medicines you take.  How can you care for yourself at home?  ?? Take pain medicines exactly as directed.  ?? If the doctor gave you a prescription medicine for pain, take it as prescribed.  ?? If you are not taking a prescription pain medicine, ask your doctor if you can take an over-the-counter medicine.  ?? Rest and protect your hand. Take a break from any activity that may cause pain.  ?? Put ice or a cold pack on your hand for 10 to 20 minutes at a time. Put a thin cloth between the ice and your skin.  ?? Prop up the sore hand on a pillow when you ice it or anytime you sit or lie down during the next 3 days. Try to keep it above the level of your heart. This will help reduce swelling.  ?? If your doctor recommends a sling, splint, or elastic bandage to support your hand, wear it as directed.  When should you call for help?  Call 911 anytime you think you may need emergency care. For example, call if:  ?? Your hand turns cool or pale or changes color.  Call your doctor now or seek immediate medical care if:  ?? You cannot move your hand.  ?? Your hand pops, moves out of its normal position, and then returns to  its normal position.  ?? You have signs of infection, such as:  ?? Increased pain, swelling, warmth, or redness.  ?? Red streaks leading from the sore area.  ?? Pus draining from a place on your hand.  ?? A fever.  ?? Your hand feels numb or tingly.  Watch closely for changes in your health, and be sure to contact your doctor if:  ?? Your hand feels unstable when you try to use it.  ?? You do not get better as expected.  ?? You have any new symptoms, such as swelling.  ?? Bruises from an injury to your hand last longer than 2 weeks.   Where can you learn more?   Go to [facility]  Enter R273 in the search box to learn more about "Hand Pain: After Your Visit."   ?? 2006-2014 [employer]. Care instructions adapted under license by [employer] (which disclaims liability or warranty for this information). This care instruction is for use with your licensed healthcare professional. If you have questions about a medical condition or this instruction, always ask your healthcare professional. [employer] disclaims any warranty or liability for your use of this information.  Content Version: 10.2.346038; Current as  of: July 28, 2012              Hand Arthritis: Exercises  Your Care Instructions  Here are some examples of exercises for hand arthritis. Start each exercise slowly. Ease off the exercise if you start to have pain.  Your doctor or physical therapist will tell you when you can start these exercises and which ones will work best for you.  How to do the exercises  Tendon glides    In this exercise, the steps follow one another to a make a continuous movement.  1. With your affected hand, point your fingers and thumb straight up. Your wrist should be relaxed, following the line of your fingers and thumb.  2. Curl your fingers so that the top two joints in them are bent, and your fingers wrap down. Your fingertips should touch or be near the base of your fingers. Your fingers will  look like a hook.  3. Make a fist by bending your knuckles. Your thumb can gently rest against your index (pointing) finger.  4. Unwind your fingers slightly so that your fingertips can touch the base of your palm. Your thumb can rest against your index finger.  5. Move back to your starting position, with your fingers and thumb pointing up.  6. Repeat the series of motions 8 to 12 times.  7. Switch hands and repeat steps 1 through 6, even if only one hand is sore.  Intrinsic flexion    1. Rest your affected hand on a table and bend the large joints where your fingers connect to your hand. Keep your thumb and the other joints in your fingers straight.  2. Slowly straighten your fingers. Your wrist should be relaxed, following the line of your fingers and thumb.  3. Move back to your starting position, with your hand bent.  4. Repeat 8 to 12 times.  5. Switch hands and repeat steps 1 through 4, even if only one hand is sore.  Finger extension    1. Place your affected hand flat on a table.  2. Lift and then lower one finger at a time off the table.  3. Repeat 8 to 12 times.  4. Switch hands and repeat steps 1 through 3, even if only one hand is sore.  MP extension    1. Place your good hand on a table, palm up. Put your affected hand on top of your good hand with your fingers wrapped around the thumb of your good hand like you are making a fist.  2. Slowly uncurl the joints of your affected hand where your fingers connect to your hand so that only the top two joints of your fingers are bent. Your fingers will look like a hook.  3. Move back to your starting position, with your fingers wrapped around your good thumb.  4. Repeat 8 to 12 times.  5. Switch hands and repeat steps 1 through 4, even if only one hand is sore.  PIP extension (with MP extension)    1. Place your good hand on a table, palm up. Put your affected hand on top of your good hand, palm up.  2. Use the thumb and fingers of your good hand to grasp  below the middle joint of one finger of your affected hand.  3. Straighten the last two joints of that finger.  4. Repeat 8 to 12 times.  5. Repeat steps 1 through 4 with each finger.  6. Switch  hands and repeat steps 1 through 5, even if only one hand is sore.  DIP flexion    1. With your good hand, grasp one finger of your affected hand. Your thumb will be on the top side of your finger just below the joint that is closest to your fingernail.  2. Slowly bend your affected finger only at the joint closest to your fingernail.  3. Repeat 8 to 12 times.  4. Repeat steps 1 through 3 with each finger.  5. Switch hands and repeat steps 1 through 4, even if only one hand is sore.  Follow-up care is a key part of your treatment and safety. Be sure to make and go to all appointments, and call your doctor if you are having problems. It's also a good idea to know your test results and keep a list of the medicines you take.   Where can you learn more?   Go to [facility]  Enter [phone] in the search box to learn more about "Hand Arthritis: Exercises."   ?? 2006-2014 [employer], [employer]. Care instructions adapted under license by [employer] (which disclaims liability or warranty for this information). This care instruction is for use with your licensed healthcare professional. If you have questions about a medical condition or this instruction, always ask your healthcare professional. [employer], [employer] disclaims any warranty or liability for your use of this information.  Content Version: 10.2.346038; Current as of: July 28, 2012              Arthritis: After Your Visit  Your Care Instructions  Arthritis, also called osteoarthritis, is a breakdown of the cartilage that cushions your joints. When the cartilage wears down, your bones rub against each other. This causes pain and stiffness. Many people have some arthritis as they age. Arthritis most often affects the joints of the spine, hands,  hips, knees, or feet.  You can take simple measures to protect your joints, ease your pain, and help you stay active.  Follow-up care is a key part of your treatment and safety. Be sure to make and go to all appointments, and call your doctor if you are having problems. It's also a good idea to know your test results and keep a list of the medicines you take.  How can you care for yourself at home?  ?? Stay at a healthy weight. Being overweight puts extra strain on your joints.  ?? Talk to your doctor or physical therapist about exercises that will help ease joint pain.  ?? Stretch. You may enjoy gentle forms of yoga to help keep your joints and muscles flexible.  ?? Walk instead of jog. Other types of exercise that are less stressful on the joints include riding a bicycle, swimming, or water exercise.  ?? Lift weights. Strong muscles help reduce stress on your joints. Stronger thigh muscles, for example, take some of the stress off of the knees and hips. Learn the right way to lift weights so you do not make joint pain worse.  ?? Take your medicines exactly as prescribed. Call your doctor if you think you are having a problem with your medicine.  ?? Take pain medicines exactly as directed.  ?? If the doctor gave you a prescription medicine for pain, take it as prescribed.  ?? If you are not taking a prescription pain medicine, ask your doctor if you can take an over-the-counter medicine.  ?? Use a cane, crutch, walker, or another device if you need help to  get around. These can help rest your joints. You also can use other things to make life easier, such as a higher toilet seat and padded handles on kitchen utensils.  ?? Do not sit in low chairs, which can make it hard to get up.  ?? Put heat or cold on your sore joints as needed. Use whichever helps you most. You also can take turns with hot and cold packs.  ?? Apply heat 2 or 3 times a day for 20 to 30 minutes???using a heating pad, hot shower, or hot pack???to relieve pain and  stiffness.  ?? Put ice or a cold pack on your sore joint for 10 to 20 minutes at a time. Put a thin cloth between the ice and your skin.  ?? Your doctor may suggest you try the supplements glucosamine and chondroitin. They are part of what makes up normal cartilage. Tell your doctor if you have diabetes, allergies to shellfish, or if you take warfarin (Coumadin).  When should you call for help?  Call your doctor now or seek immediate medical care if:  ?? You have sudden swelling, warmth, or pain in any joint.  ?? You have joint pain and a fever or rash.  ?? You have such bad pain that you cannot use a joint.  Watch closely for changes in your health, and be sure to contact your doctor if:  ?? You have mild joint symptoms that continue even with more than 6 weeks of care at home.  ?? You have stomach pain or other problems with your medicine.   Where can you learn more?   Go to [facility]  Enter [phone] in the search box to learn more about "Arthritis: After Your Visit."   ?? 2006-2014 [employer], [employer]. Care instructions adapted under license by [employer] (which disclaims liability or warranty for this information). This care instruction is for use with your licensed healthcare professional. If you have questions about a medical condition or this instruction, always ask your healthcare professional. [employer], [employer] disclaims any warranty or liability for your use of this information.  Content Version: 10.2.346038; Current as of: Jul 11, 2011

## 2013-02-02 ENCOUNTER — Encounter

## 2013-02-02 NOTE — Progress Notes (Signed)
Sharlot Gowda, M.D., F.A.C.S.  GENERAL SURGERY  [address]  [facility] [place] [address]  [phone]  FAX: [phone]            No ref. provider found  Referring Provider:No referring provider defined for this encounter.  PCP: Arlana Lindau, MD      Chief Complaint:  Chief Complaint   Patient presents with   ??? Hand Pain     states has increased swelling          Procedure:    None, ultrasound of bilateral axilla, September 16, 2012       Subjective:     Patient has complaints of pain. Patient is complaining of pain in multiple areas to include the lower back, multiple joints, in particular the right hand. He denies history trauma.  He denies numbness or paresthesia. He denies motor weakness.    He requests pain management, and has been unable to get him with the pain management physician after recommendation by his FP.  He used to take OxyContin 15 mg four times a day as needed. In addition he was taking Ambien for sleep and is complaining of insomnia.  He reports of valuation by  neurosurgeon times two without recommendation for surgery.    He denies a history of gout.  Denies history of autoimmune disease, admits to having meat consumption. He denies alcohol intake. He smokes approximately a half a pack a day.    He denies swollen lymph nodes. He denies fever chills or night sweats. No change in appetite or weight loss.           Review of Systems  A comprehensive review of systems was negative except for that written in the History of Present Illness.    Objective:     Visit Vitals   Item Reading   ??? BP 126/82   ??? Pulse 100   ??? Resp 16   ??? Wt 228 lb (103.42 kg)   ??? BMI 30.92 kg/m2   ??? SpO2 98%                Physical Exam:     General appearance - alert, well appearing, and in no distress  Mouth - mucous membranes moist, pharynx normal without lesions  Chest - clear to auscultation, no wheezes, rales or rhonchi, symmetric air entry  Heart - normal  rate, regular rhythm, normal S1, S2, no murmurs, rubs, clicks or gallops  Abdomen - soft, nontender, nondistended, no masses or organomegaly  Extremities - asymmetric swelling over the MCP of the third digit on the right-hand, palpable radial and ulnar pulse with some mild edema on the dorsum of the hand without erythema< SEE PHOTO UNDER MEDIA    Negative supraclavicular, inguinal adenopathy or cervical adenopathy, there's a 2 cm palpable lymph node that is mobile in the left axilla    No suspicious skin lesions on the arms, thorax, upper back or scalp       Labs: No results found for this or any previous visit (from the past 24 hour(s)).    Meds:   Current Outpatient Prescriptions   Medication Sig Dispense Refill   ??? predniSONE (DELTASONE) 10 mg tablet Take as directed with meals (Breakfast, Lunch, Dinner):  2-2-2, then 2-2-1, then 2-1-1, then 1-1-1, then 1-0-1, then 1-0-0  21 tablet  0   ??? indomethacin (INDOCIN) 50 mg capsule Take 1 capsule by mouth three (3) times daily.  30 capsule  0   ??? azithromycin (ZITHROMAX) 250 mg tablet Take two tablets today then one tablet daily  6 Tab  0   ??? aspirin delayed-release 81 mg tablet Take  by mouth daily.       ??? vardenafil (LEVITRA) 20 mg tablet Take 20 mg by mouth as needed for 2 doses.  2 Tab  0   ??? omeprazole (PRILOSEC) 40 mg capsule Take 40 mg by mouth daily.       ??? lidocaine (LIDODERM) 5 %(700 mg/patch) 1 Patch by TransDERmal route every twelve (12) hours. 3 patches       ??? gabapentin (NEURONTIN) 300 mg capsule Take 600 mg by mouth four (4) times daily.       ??? lisinopril (PRINIVIL, ZESTRIL) 20 mg tablet Take 40 mg by mouth daily.             Data Review images and reports reviewed    Assessment:     Patient Active Problem List   Diagnosis Code   ??? Abdominal  pain, other specified site 789.09   ??? Hematospermia 608.82   ??? Urinary urgency 788.63   ??? Nocturia 788.43   ??? Tobacco abuse 305.1   ??? 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   ??? Axillary adenopathy Yes   ??? Chronic pain    ??? Joint pain          Plan/Recommendations/Medical Decision Making:     1. Ultrasound with possible FNA left axillary adenopathy, follow-up pending biopsy  2. Patient has follow-up at new FP in [place], [facility] outreach.  Instructed to discuss pain management issues. If pain worsens proceed to emergency department.

## 2013-02-02 NOTE — Addendum Note (Signed)
Addended by: Luberta Robertson on: 02/02/2013 01:54 PM     Modules accepted: Orders

## 2013-02-04 NOTE — Telephone Encounter (Signed)
Patient called to let Dr. Yetta Barre know he had to reschedule ultrasound and biopsy because he had taken aspirin. He was calling imaging to reschedule appointment.

## 2013-02-10 ENCOUNTER — Encounter

## 2013-02-10 NOTE — ED Notes (Signed)
I have reviewed discharge instructions with the patient.  The patient verbalized understanding.

## 2013-02-10 NOTE — ED Notes (Signed)
Pt. C/o right hand pain, bilateral hip pain back pain , leg pain denies new injury.

## 2013-02-10 NOTE — ED Provider Notes (Signed)
Patient is a 52 y.o. male presenting with hand pain, hip pain, and back pain. The history is provided by the patient.   Hand Pain   This is a recurrent (Pt c/o pain in R hand and lower back.  He denies any specific injury,  Pain is worse with movement.  Prior h/o chronic back pain .  Pain in back radiates down both legs.  ) problem. The problem occurs constantly. The problem has not changed since onset.The pain is moderate. Associated symptoms include limited range of motion, stiffness and back pain. Pertinent negatives include no numbness.   Hip Pain   Associated symptoms include limited range of motion, stiffness and back pain. Pertinent negatives include no numbness.   Back Pain   Pertinent negatives include no chest pain, no fever, no numbness, no headaches, no abdominal pain, no dysuria and no weakness.        Past Medical History   Diagnosis Date   ??? Hypertension    ??? Gastrointestinal disorder      acid reflux   ??? Hyperlipidemia    ??? Other ill-defined conditions      Hernia x 3 near umbilical area, bladder problems;  Glaucoma   ??? Diabetes      Borderline Diabetic   ??? GERD (gastroesophageal reflux disease)    ??? Chronic pain      neck and back   ??? 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 Arthritis    ??? Asthma      bronchitis        Past Surgical History   Procedure Laterality Date   ??? Cystoscopy  12/2009   ??? Hx other surgical       hernia   ??? Pr abdomen surgery proc unlisted       Hernia repair x2 inguinal   ??? Hx urological       Cystoscopy   ??? Hx orthopaedic       right shoulder   ??? Hx orthopaedic  07/2011     bilateral knee   ??? Hx hernia repair           Family History   Problem Relation Age of Onset   ??? Cancer Mother    ??? Lung Disease Mother    ??? Hypertension Mother    ??? Stroke Father    ??? Diabetes Father    ??? Hypertension Father    ??? Other Other      Family hx of brain tumors   ??? Heart Disease Other    ??? Diabetes Other    ??? Arthritis-osteo Sister    ??? Cancer Sister    ??? Diabetes Sister    ??? Hypertension Sister    ???  Diabetes Brother    ??? Hypertension Brother         History     Social History   ??? Marital Status: MARRIED     Spouse Name: N/A     Number of Children: N/A   ??? Years of Education: N/A     Occupational History   ??? Not on file.     Social History Main Topics   ??? Smoking status: Current Every Day Smoker -- 0.25 packs/day for 24 years   ??? Smokeless tobacco: Never Used   ??? Alcohol Use: No      Comment: seldom 12 pk per month   ??? Drug Use: No   ??? Sexually Active: Yes -- Male partner(s)  Birth Control/ Protection: None     Other Topics Concern   ??? Not on file     Social History Narrative   ??? No narrative on file                  ALLERGIES: Darvocet a500; Other medication; Robaxin; Toradol; and Tramadol      Review of Systems   Constitutional: Negative for fever and chills.   HENT: Negative for ear pain, congestion and postnasal drip.    Eyes: Negative for pain.   Respiratory: Negative for cough and shortness of breath.    Cardiovascular: Negative for chest pain and palpitations.   Gastrointestinal: Negative for nausea, vomiting and abdominal pain.   Genitourinary: Negative for dysuria and hematuria.   Musculoskeletal: Positive for back pain and stiffness.        Pain in R hand   Skin: Negative for rash.   Neurological: Negative for weakness, numbness and headaches.       Filed Vitals:    02/10/13 0928   BP: 125/97   Pulse: 94   Temp: 97.3 ??F (36.3 ??C)   Resp: 18   Height: 6' (1.829 m)   Weight: 101.606 kg (224 lb)   SpO2: 97%            Physical Exam   Nursing note and vitals reviewed.  Constitutional: He is oriented to person, place, and time. He appears well-developed and well-nourished.   HENT:   Head: Normocephalic.   Right Ear: External ear normal.   Left Ear: External ear normal.   Nose: Nose normal.   Mouth/Throat: Oropharynx is clear and moist.   Eyes: EOM are normal. Pupils are equal, round, and reactive to light.   Neck: Normal range of motion. Neck supple.   Cardiovascular: Normal rate, regular rhythm and  normal heart sounds.    Pulmonary/Chest: Effort normal. He has no wheezes. He has no rales.   Abdominal: Soft. Bowel sounds are normal. There is no tenderness.   Musculoskeletal: He exhibits no edema.   Tenderness over the dorsal aspect of the R hand over the 3rd MCP jt.  No STS or ecchymosis.  There is also diffuse tenderness in the central Lumbar region that seems far out of proportion to physical findings.     Lymphadenopathy:     He has no cervical adenopathy.   Neurological: He is alert and oriented to person, place, and time.   Skin: Skin is warm and dry. No rash noted.   Psychiatric: He has a normal mood and affect.        MDM     Amount and/or Complexity of Data Reviewed:   Tests in the radiology section of CPT??:  Ordered and reviewed      Procedures

## 2013-02-21 NOTE — Progress Notes (Signed)
Quick Note:    Patient scheduled to follow-up 12/30.  ______

## 2013-02-21 NOTE — Progress Notes (Signed)
Quick Note:    Ultrasound results noted, 2 month follow up  ______

## 2013-03-23 ENCOUNTER — Encounter

## 2013-05-10 ENCOUNTER — Encounter

## 2013-05-10 LAB — CBC W/O DIFF
HCT: 45 % (ref 41–53)
HGB: 16 g/dL (ref 13.5–17.5)
MCH: 30.1 PG (ref 27–31)
MCHC: 35.6 g/dL (ref 31–37)
MCV: 84.7 FL (ref 78–98)
MPV: 9.8 FL (ref 5.9–10.3)
PLATELET: 148 10*3/uL (ref 130–400)
RBC: 5.31 M/uL (ref 4.7–6.1)
RDW: 13 % (ref 11.5–14.5)
WBC: 6.5 10*3/uL (ref 4.5–10.8)

## 2013-05-10 LAB — PTT: aPTT: 39.8 s — ABNORMAL HIGH (ref 24.1–38.0)

## 2013-05-10 LAB — PROTHROMBIN TIME + INR
INR: 1 (ref 0.9–1.1)
Prothrombin time: 13.5 s (ref 12.5–15.0)

## 2013-05-10 MED ORDER — NALOXONE 0.4 MG/ML INJECTION
0.4 mg/mL | INTRAMUSCULAR | Status: DC | PRN
Start: 2013-05-10 — End: 2013-05-10

## 2013-05-10 MED ORDER — LIDOCAINE HCL 2 % (20 MG/ML) IJ SOLN
20 mg/mL (2 %) | Freq: Once | INTRAMUSCULAR | Status: AC
Start: 2013-05-10 — End: 2013-05-10
  Administered 2013-05-10: 15:00:00 via INTRADERMAL

## 2013-05-10 MED ORDER — IOPAMIDOL 61 % INTRATHECAL
300 mg iodine /mL (61 %) | Freq: Once | INTRATHECAL | Status: AC
Start: 2013-05-10 — End: 2013-05-10
  Administered 2013-05-10: 15:00:00 via INTRASPINAL

## 2013-05-10 MED ADMIN — 0.9% sodium chloride infusion: INTRAVENOUS | @ 14:00:00 | NDC 00409798309

## 2013-05-10 MED ADMIN — 0.9% sodium chloride infusion: INTRAVENOUS | @ 13:00:00 | NDC 87701099893

## 2013-05-10 MED ADMIN — oxyCODONE-acetaminophen (PERCOCET) 5-325 mg per tablet 1 Tab: ORAL | @ 17:00:00 | NDC 68084035511

## 2013-05-10 MED FILL — OXYCODONE-ACETAMINOPHEN 5 MG-325 MG TAB: 5-325 mg | ORAL | Qty: 1

## 2013-05-10 MED FILL — NORMAL SALINE FLUSH 0.9 % INJECTION SYRINGE: INTRAMUSCULAR | Qty: 10

## 2013-05-10 MED FILL — SODIUM CHLORIDE 0.9 % IV: INTRAVENOUS | Qty: 1000

## 2013-05-10 MED FILL — ISOVUE-M 300  61 % INTRATHECAL SOLUTION: 300 mg iodine /mL (61 %) | INTRATHECAL | Qty: 15

## 2013-05-10 NOTE — Procedures (Signed)
Complete myelogram. No complications. Refer to xray rpt.

## 2013-05-10 NOTE — Progress Notes (Addendum)
Back from radiology via bed. Awake, VSS,see flowheet, band aide to lower back C/D/I. Instructed on post orders and bedrest orders, instructed pt bedrest up 1450.

## 2013-05-10 NOTE — Progress Notes (Addendum)
Reprinted discharge instructions per pt request due to page tore when attempted to turn page, for summary report.

## 2013-05-10 NOTE — Progress Notes (Addendum)
To radiology via bed

## 2013-05-10 NOTE — Progress Notes (Signed)
Easily awaken, VSS see flowsheet. Call light within reach. HOB 20 degrees. Wife at bedside.

## 2013-05-10 NOTE — Progress Notes (Addendum)
Discharge instructions papers given for pt to review. Pt request summary report

## 2013-05-10 NOTE — Progress Notes (Addendum)
Medicated as ordered for back pain, see flowsheet. Food tray delivered. Wife at bedside

## 2013-05-10 NOTE — Progress Notes (Signed)
To [facility] from registration, to room 462. Oriented to room and gown given.

## 2013-05-10 NOTE — Progress Notes (Signed)
Called pt cell phone [phone], no answer

## 2013-05-10 NOTE — Progress Notes (Signed)
Notified Safety officer Ann LionsSandy Daniels

## 2013-05-10 NOTE — Procedures (Signed)
Complete myelogram. No complications. Refer to xray rpt.

## 2013-05-10 NOTE — Progress Notes (Signed)
Upon entering, pt gone,  gown lying on bed. No saline lock found, however saline lock in biohazard box in room.

## 2013-05-10 NOTE — Progress Notes (Signed)
Contacted patient at [phone].  Asked patient if he removed IV prior to leaving hospital.  Mr. Jesse Montoya stated "Yes I did."

## 2013-05-10 NOTE — Progress Notes (Addendum)
Nursing supervisor notified, pt left without discharge and poss saline lock in. Called Security and paged pt room overhead

## 2013-05-10 NOTE — Progress Notes (Signed)
Called for stat labs

## 2013-05-10 NOTE — Progress Notes (Addendum)
Procedure completed in [facility] at 11:05a.m., injection site cleaned,band aid applied.  Patient transported via stretcher to CT for post scan.  CT post scan completed.  Patient tol procedure well.  BP 118/84  P 75 R 18  O2sat 98% on room air  Report called to [facility], CVRU  Post-Procedure orders are as follows:  Maintain patient with head of bed 30* to 45*  Encourage fluids and diet as tolerated  Vital signs q 30 min x1,q hr.x1, then routine  Notify procedure physician if complications should arise  If no complications, may discharge home after 3 hours      NOTE:Upon discharge instructed patient to go home and rest quietly, no driving/lifting bending or exercise for the next 24 hours.  Instruct patient to maintain head elevation for 3 hours post-discharge  Drink plenty of liquids for re-hydration.  If spinal headache (headache when standing and/or slight nausea relieved by lying down) then drink caffienated beverage.  Patient may resume normal diet.  Instructed to avoid aspirin, ibuprofen, Aleve, Vit E or blood-thinners for 3 days after procedure.  Patient may take Tylenol (1 or 2 tablets every 6 hours) for mild discomfort.  Keep the dressing clean and dry for 2 days. Replace with Band-Aid  Report any redness, warmth, drainage or temperature greater than 101* to your physician  Call your physician for follow up appointment. The results will be sent directly to physician within 3 business working days.  If you experience any of the following symptoms :  Persistent severe headache (not relieved by lying down) or neck pain/stiffness.  Fever of greater than 101 degrees.  Severe dizziness, confusion, or blurred vision.  Contact your regular doctor or go to the nearest emergency room.  Any [facility] [phone]

## 2013-05-10 NOTE — Progress Notes (Signed)
Called cell phone per Luttrelllaudia, no answer

## 2013-05-10 NOTE — Progress Notes (Signed)
Chart screen by Case Management. Per chart review, no case management intervention needed at this time. Case management will remain available for consult if needs arise on admission.

## 2013-05-10 NOTE — Progress Notes (Signed)
Notified nurse manager Yvonna AlanisAndrew Walker

## 2014-02-14 ENCOUNTER — Encounter

## 2014-02-27 ENCOUNTER — Ambulatory Visit: Primary: Internal Medicine

## 2014-03-01 ENCOUNTER — Ambulatory Visit: Payer: MEDICARE | Primary: Internal Medicine

## 2014-03-09 ENCOUNTER — Encounter

## 2014-03-09 ENCOUNTER — Inpatient Hospital Stay: Admit: 2014-03-09 | Payer: MEDICARE | Attending: Internal Medicine | Primary: Internal Medicine

## 2014-03-09 DIAGNOSIS — R072 Precordial pain: Secondary | ICD-10-CM

## 2014-03-09 DIAGNOSIS — R52 Pain, unspecified: Secondary | ICD-10-CM

## 2014-03-09 MED ORDER — METHACHOLINE 0.25 MG/ML INHALATION SOLUTION CPD
0.25 mg/ml | Freq: Once | RESPIRATORY_TRACT | Status: AC
Start: 2014-03-09 — End: 2014-03-09
  Administered 2014-03-09: 17:00:00 via RESPIRATORY_TRACT

## 2014-03-09 MED ORDER — TECHNETIUM TC 99M TETROFOSMIN IV KIT
Freq: Once | Status: AC
Start: 2014-03-09 — End: 2014-03-09
  Administered 2014-03-09: 14:00:00 via INTRAVENOUS

## 2014-03-09 MED ORDER — REGADENOSON 0.4 MG/5 ML IV SYRINGE
0.4 mg/5 mL | Freq: Once | INTRAVENOUS | Status: AC
Start: 2014-03-09 — End: 2014-03-09
  Administered 2014-03-09: 15:00:00 via INTRAVENOUS

## 2014-03-09 MED ORDER — ALBUTEROL SULFATE 2.5 MG/0.5 ML NEB SOLUTION
2.5 mg/0.5 mL | RESPIRATORY_TRACT | Status: AC
Start: 2014-03-09 — End: 2014-03-09
  Administered 2014-03-09: 18:00:00 via RESPIRATORY_TRACT

## 2014-03-09 MED ORDER — METHACHOLINE 25 MG/ML INHALATION SOLUTION CPD
25 mg/ml | Freq: Once | RESPIRATORY_TRACT | Status: AC
Start: 2014-03-09 — End: 2014-03-09
  Administered 2014-03-09: 18:00:00 via RESPIRATORY_TRACT

## 2014-03-09 MED ORDER — TECHNETIUM TC 99M TETROFOSMIN IV KIT
Freq: Once | Status: AC
Start: 2014-03-09 — End: 2014-03-09
  Administered 2014-03-09: 13:00:00 via INTRAVENOUS

## 2014-03-09 MED ORDER — SODIUM CHLORIDE 0.9 % NEB SOLUTION
0.9 % | RESPIRATORY_TRACT | Status: DC | PRN
Start: 2014-03-09 — End: 2014-03-13
  Administered 2014-03-09: 18:00:00 via RESPIRATORY_TRACT

## 2014-03-09 MED ORDER — METHACHOLINE 0.025 MG/ML INHALATION SOLUTION CPD
0.025 mg/ml | Freq: Once | RESPIRATORY_TRACT | Status: AC
Start: 2014-03-09 — End: 2014-03-09
  Administered 2014-03-09: 17:00:00 via RESPIRATORY_TRACT

## 2014-03-09 MED ORDER — METHACHOLINE 10 MG/ML INHALATION SOLUTION CPD
10 mg/ml | Freq: Once | RESPIRATORY_TRACT | Status: AC
Start: 2014-03-09 — End: 2014-03-09
  Administered 2014-03-09: 18:00:00 via RESPIRATORY_TRACT

## 2014-03-09 MED ORDER — METHACHOLINE 2.5 MG/ML INHALATION SOLUTION CPD
2.5 mg/ml | Freq: Once | RESPIRATORY_TRACT | Status: AC
Start: 2014-03-09 — End: 2014-03-09
  Administered 2014-03-09: 17:00:00 via RESPIRATORY_TRACT

## 2014-03-09 MED FILL — TECHNETIUM TC 99M TETROFOSMIN IV KIT: Qty: 30

## 2014-03-09 MED FILL — TECHNETIUM TC 99M TETROFOSMIN IV KIT: Qty: 10

## 2014-03-09 MED FILL — METHACHOLINE 0.025 MG/ML INHALATION SOLUTION CPD: 0.025 mg/ml | RESPIRATORY_TRACT | Qty: 1

## 2014-03-09 MED FILL — METHACHOLINE 0.25 MG/ML INHALATION SOLUTION CPD: 0.25 mg/ml | RESPIRATORY_TRACT | Qty: 1

## 2014-03-09 MED FILL — METHACHOLINE 10 MG/ML INHALATION SOLUTION CPD: 10 mg/ml | RESPIRATORY_TRACT | Qty: 1

## 2014-03-09 MED FILL — ALBUTEROL SULFATE 2.5 MG/0.5 ML NEB SOLUTION: 2.5 mg/0.5 mL | RESPIRATORY_TRACT | Qty: 0.5

## 2014-03-09 MED FILL — LEXISCAN 0.4 MG/5 ML INTRAVENOUS SYRINGE: 0.4 mg/5 mL | INTRAVENOUS | Qty: 5

## 2014-03-09 MED FILL — METHACHOLINE 2.5 MG/ML INHALATION SOLUTION CPD: 2.5 mg/ml | RESPIRATORY_TRACT | Qty: 1

## 2014-03-09 MED FILL — METHACHOLINE 25 MG/ML INHALATION SOLUTION CPD: 25 mg/ml | RESPIRATORY_TRACT | Qty: 1

## 2014-03-09 NOTE — Progress Notes (Signed)
STRESS MYOVIEW WAS COMPLETED 9:52 AM WITH NO COMPLICATIONS BY RACHEL R WALKER.    PATIENTS RESTING IMAGE DOSE IS 10[facility] MYOVIEW.  PATIENTS STRESS IMAGE DOSE IS 30.Marland Kitchen.8[facility] MYOVIEW.    DR. Elpidio AnisPRASAD. WILL READ STRESS TEST.

## 2014-03-11 NOTE — Procedures (Signed)
[facility]      PT Name:  Gavyn, Ybarra         Admitted:  03/09/2014  MR#:  [id number]                     DOB:  [birth year]  Account #:  [id number]            Age:  54  Dictator:  Adan Sis, MD      Location:                        Cardiology - GXT - DRUG INDUCED TEST  Reason for GXT:  Precordial pain, HCC.  Medications:  Meds in computer.  Allergies:  Allergies in computer.  INTERPRETATION OF STRESS TEST:  Protocol:      Lexiscan  Age: 53Y  Height:   6'0" Weight:   225#  Sex: M    Race:    * * * LEXISCAN 0.4 MG ADMIN. * * *    BLOOD PRESSURE      HEART RATE               MET  REST:     126/82              REST:     85  1 MIN:    140/92                   111  2 MIN:    140/92                   105  3 MIN:    130/88                   98  MIN:  MIN:  PEAK:                    PEAK:    * * * POST EXERCISE RESPONSE * * *    BLOOD PRESSURE      HEART RATE  1 MIN:    130/88              94  3 MIN:    132/86              94  MIN:  MIN:      HEART - - PREDICTED MHR:      MHR ACHIEVED:   %   PEAK HR ACHIEVED:  TOTAL TIME:         TARGET (85% MHR):        STAGE:         TIME:    SYMPTOMS/REASON FOR STOPPING:  No chest pain.  PRE-EXERCISE ECG:  Normal sinus rhythm; incomplete right bundle  branch block.  EKG RESPONSE:  No significant ST segment changes noted.  No  arrhythmia.  PEAK ACTIVITY LEVEL:       METS.   MAXIMUM 02 CONSUMPTION (VO2):  INDEX OF CARDIAC WORK:        FITNESS CLASSIFICATION:    INTERPRETATION:  1. Negative electrocardiographic test in response to Lexiscan.  2. Myoview distribution report is pending.    Patient education complete.    _______________________  Adan Sis, M.D.    YP:bsb  DD: 03/11/2014 00:00:00  DT:  03/11/2014 11:12:25    CC:      Document #:  [id number]

## 2014-03-11 NOTE — Procedures (Signed)
[facility]      PT Name:  Jesse Montoya, Jesse Montoya         Admitted:  03/09/2014  MR#:  [id number]                     DOB:  04/25/1960  Account #:  [id number]            Age:  53  Dictator:  Trampus Mcquerry, MD      Location:                        Cardiology - GXT - DRUG INDUCED TEST  Reason for GXT:  Precordial pain, HCC.  Medications:  Meds in computer.  Allergies:  Allergies in computer.  INTERPRETATION OF STRESS TEST:  Protocol:      Lexiscan  Age: 53Y  Height:   6'0" Weight:   225#  Sex: M    Race:    * * * LEXISCAN 0.4 MG ADMIN. * * *    BLOOD PRESSURE      HEART RATE               MET  REST:     126/82              REST:     85  1 MIN:    140/92                   111  2 MIN:    140/92                   105  3 MIN:    130/88                   98  MIN:  MIN:  PEAK:                    PEAK:    * * * POST EXERCISE RESPONSE * * *    BLOOD PRESSURE      HEART RATE  1 MIN:    130/88              94  3 MIN:    132/86              94  MIN:  MIN:      HEART - - PREDICTED MHR:      MHR ACHIEVED:   %   PEAK HR ACHIEVED:  TOTAL TIME:         TARGET (85% MHR):        STAGE:         TIME:    SYMPTOMS/REASON FOR STOPPING:  No chest pain.  PRE-EXERCISE ECG:  Normal sinus rhythm; incomplete right bundle  branch block.  EKG RESPONSE:  No significant ST segment changes noted.  No  arrhythmia.  PEAK ACTIVITY LEVEL:       METS.   MAXIMUM 02 CONSUMPTION (VO2):  INDEX OF CARDIAC WORK:        FITNESS CLASSIFICATION:    INTERPRETATION:  1. Negative electrocardiographic test in response to Lexiscan.  2. Myoview distribution report is pending.    Patient education complete.    _______________________  Wahid Holley, M.D.    YP:bsb  DD: 03/11/2014 00:00:00  DT:  03/11/2014 11:12:25    CC:      Document #:  [id number]

## 2014-04-06 ENCOUNTER — Encounter

## 2014-04-06 ENCOUNTER — Inpatient Hospital Stay: Admit: 2014-04-06 | Payer: MEDICARE | Attending: Internal Medicine | Primary: Internal Medicine

## 2014-04-06 DIAGNOSIS — I82401 Acute embolism and thrombosis of unspecified deep veins of right lower extremity: Secondary | ICD-10-CM

## 2015-08-03 ENCOUNTER — Emergency Department: Admit: 2015-08-03 | Payer: MEDICARE | Primary: Internal Medicine

## 2015-08-03 ENCOUNTER — Inpatient Hospital Stay
Admit: 2015-08-03 | Discharge: 2015-08-03 | Disposition: A | Discharge status: Home / Self Care | Payer: MEDICARE | Attending: Personal Emergency Response Attendant

## 2015-08-03 DIAGNOSIS — M25511 Pain in right shoulder: Secondary | ICD-10-CM

## 2015-08-03 MED ORDER — ACETAMINOPHEN-CODEINE 300 MG-30 MG TAB
300-30 mg | ORAL_TABLET | ORAL | 0 refills | Status: AC | PRN
Start: 2015-08-03 — End: ?

## 2015-08-03 NOTE — ED Notes (Signed)
I have reviewed discharge instructions with the patient.  The patient verbalized understanding.

## 2015-08-03 NOTE — ED Provider Notes (Addendum)
HPI Comments: Patient presents with right shoulder pain.  States hx shoulder surg with replacement and rotator cuff repair.  Reports for last month, pain and feeling like shoulder is "out of place".  Denies numbness/tingling, pale/cold extremity, fever, chills, nausea, vomiting, new injury/trauma    Patient is a 55 y.o. male presenting with shoulder pain. The history is provided by the patient. No language interpreter was used.   Shoulder Pain    The incident occurred more than 1 week ago (x 1 month). There was no injury mechanism. The right shoulder is affected. The pain is at a severity of 9/10. The pain is severe. The pain has been constant since onset. The pain does not radiate. There is a history of shoulder injury. He has no other injuries. There is a history of shoulder surgery. Associated symptoms include muscle weakness. Pertinent negatives include no numbness.        Past Medical History:   Diagnosis Date   ??? Arthritis    ??? Asthma     bronchitis   ??? Chronic pain     neck and back   ??? Diabetes (HCC)     Borderline Diabetic   ??? Gastrointestinal disorder     acid reflux   ??? GERD (gastroesophageal reflux disease)    ??? Hyperlipidemia    ??? Hypertension    ??? Other ill-defined conditions     Hernia x 3 near umbilical area, bladder problems;  Glaucoma   ??? Other ill-defined conditions     diverticulosis   ??? 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 ABDOMEN SURGERY PROC UNLISTED      Hernia repair x2 inguinal   ??? CYSTOSCOPY  12/2009   ??? HX HERNIA REPAIR     ??? HX ORTHOPAEDIC      right shoulder   ??? HX ORTHOPAEDIC  07/2011    bilateral knee   ??? HX OTHER SURGICAL      hernia   ??? HX UROLOGICAL      Cystoscopy         Family History:   Problem Relation Age of Onset   ??? Cancer Mother    ??? Lung Disease Mother    ??? Hypertension Mother    ??? Stroke Father    ??? Diabetes Father    ??? Hypertension Father    ??? Other Other      Family hx of brain tumors   ??? Heart Disease Other     ??? Diabetes Other    ??? Arthritis-osteo Sister    ??? Cancer Sister    ??? Diabetes Sister    ??? Hypertension Sister    ??? Diabetes Brother    ??? Hypertension Brother        Social History     Social History   ??? Marital status: MARRIED     Spouse name: N/A   ??? Number of children: N/A   ??? Years of education: N/A     Occupational History   ??? Not on file.     Social History Main Topics   ??? Smoking status: Current Every Day Smoker     Packs/day: 0.25     Years: 24.00   ??? Smokeless tobacco: Never Used   ??? Alcohol use No      Comment: seldom 12 pk per month   ??? Drug use: No   ??? Sexual activity:  Yes     Partners: Female     Birth control/ protection: None     Other Topics Concern   ??? Not on file     Social History Narrative         ALLERGIES: Darvocet a500 [propoxyphene n-acetaminophen]; Other medication; Robaxin [methocarbamol]; Toradol [ketorolac]; and Tramadol    Review of Systems   Constitutional: Negative for activity change, chills, fatigue and fever.   HENT: Negative for congestion, drooling, nosebleeds, postnasal drip and sneezing.    Eyes: Negative for pain, discharge and redness.   Respiratory: Negative for choking, chest tightness, shortness of breath and wheezing.    Cardiovascular: Negative for chest pain and palpitations.   Gastrointestinal: Negative for abdominal pain, diarrhea, nausea and vomiting.   Endocrine: Negative for cold intolerance and polydipsia.   Musculoskeletal: Positive for arthralgias. Negative for myalgias and neck pain.   Skin: Negative for pallor and rash.   Allergic/Immunologic: Negative for food allergies and immunocompromised state.   Neurological: Negative for syncope, weakness, numbness and headaches.   Hematological: Negative for adenopathy.   Psychiatric/Behavioral: Negative for agitation, decreased concentration and suicidal ideas. The patient is not nervous/anxious.    All other systems reviewed and are negative.      Vitals:    08/03/15 1246   BP: 165/78   Pulse: (!) 105   Resp: 17    Temp: 98 ??F (36.7 ??C)   SpO2: 96%   Weight: 108.9 kg (240 lb)   Height: 6' (1.829 m)            Physical Exam   Constitutional: He is oriented to person, place, and time. He appears well-developed and well-nourished.   HENT:   Head: Normocephalic.   Right Ear: External ear normal.   Left Ear: External ear normal.   Mouth/Throat: No oropharyngeal exudate.   Eyes: Conjunctivae are normal. Pupils are equal, round, and reactive to light. Right eye exhibits no discharge. Left eye exhibits no discharge.   Neck: Normal range of motion. No tracheal deviation present.   Cardiovascular: Normal rate and intact distal pulses.  Exam reveals no gallop.    Pulmonary/Chest: Effort normal. No stridor. No respiratory distress. He has no wheezes. He has no rales.   Musculoskeletal: He exhibits tenderness. He exhibits no edema.        Arms:  Lymphadenopathy:     He has no cervical adenopathy.   Neurological: He is alert and oriented to person, place, and time. No cranial nerve deficit. Coordination normal.   Skin: Skin is warm. No rash noted. He is not diaphoretic. No erythema.   Psychiatric: He has a normal mood and affect. His behavior is normal. Thought content normal.   Nursing note and vitals reviewed.       MDM  Number of Diagnoses or Management Options  Acute pain of right shoulder: established and worsening     Amount and/or Complexity of Data Reviewed  Tests in the radiology section of CPT??: ordered and reviewed  Review and summarize past medical records: yes  Discuss the patient with other providers: yes  Independent visualization of images, tracings, or specimens: yes    Risk of Complications, Morbidity, and/or Mortality  Presenting problems: moderate  Diagnostic procedures: moderate  Management options: moderate      ED Course       Procedures    2:53 PM  Right Shoulder XR shows post surg changes    Patient discussed with Dr. Reita May. Agree with assessment/plan.

## 2015-08-03 NOTE — ED Notes (Signed)
C/o right shoulder pain x 1 month. Surgery to the right shoulder in Aug of last year and again in March of this year. Aaox3, nad, pwd. + radial pulse.

## 2015-08-03 NOTE — ED Triage Notes (Signed)
Pt to ED for reports of shoulder pain.  Per pt he has had 2 surgeries on right shoulder.  States "it feels like the implant is out".  Pt denies injury

## 2015-09-19 ENCOUNTER — Encounter

## 2015-09-27 ENCOUNTER — Inpatient Hospital Stay: Admit: 2015-09-27 | Payer: MEDICARE | Attending: Orthopaedic Surgery | Primary: Internal Medicine

## 2015-09-27 DIAGNOSIS — M75101 Unspecified rotator cuff tear or rupture of right shoulder, not specified as traumatic: Secondary | ICD-10-CM

## 2015-09-27 MED ORDER — IOPAMIDOL 61 % IV SOLN
300 mg iodine /mL (61 %) | Freq: Once | INTRAVENOUS | Status: AC
Start: 2015-09-27 — End: 2015-09-27
  Administered 2015-09-27: 14:00:00 via INTRA_ARTICULAR

## 2015-09-27 MED ORDER — LIDOCAINE HCL 2 % (20 MG/ML) IJ SOLN
20 mg/mL (2 %) | Freq: Once | INTRAMUSCULAR | Status: AC
Start: 2015-09-27 — End: 2015-09-27
  Administered 2015-09-27: 14:00:00 via INTRADERMAL

## 2015-09-27 MED FILL — LIDOCAINE HCL 2 % (20 MG/ML) IJ SOLN: 20 mg/mL (2 %) | INTRAMUSCULAR | Qty: 10

## 2015-09-27 MED FILL — ISOVUE-300  61 % INTRAVENOUS SOLUTION: 300 mg iodine /mL (61 %) | INTRAVENOUS | Qty: 30

## 2015-09-27 NOTE — Progress Notes (Signed)
RT SHOULDER ARTHROGRAM COMPLETED UNDER FLUORO WITH NO COMPLICATIONS @ 9:43 AM   Jesse Montoya

## 2015-10-24 ENCOUNTER — Inpatient Hospital Stay: Admit: 2015-10-24 | Payer: MEDICARE | Primary: Internal Medicine

## 2015-10-24 DIAGNOSIS — T8489XA Other specified complication of internal orthopedic prosthetic devices, implants and grafts, initial encounter: Secondary | ICD-10-CM

## 2015-10-24 LAB — CBC WITH AUTOMATED DIFF
ABS. BASOPHILS: 0 10*3/uL (ref 0.0–0.1)
ABS. EOSINOPHILS: 0.1 10*3/uL (ref 0.0–0.5)
ABS. LYMPHOCYTES: 2 10*3/uL (ref 0.8–3.5)
ABS. MONOCYTES: 0.5 10*3/uL — ABNORMAL LOW (ref 0.8–3.5)
ABS. NEUTROPHILS: 4.2 10*3/uL (ref 1.5–8.0)
BASOPHILS: 0 % (ref 0–2)
EOSINOPHILS: 1 % (ref 0–5)
HCT: 45.6 % (ref 41–53)
HGB: 15.6 g/dL (ref 13.5–17.5)
LYMPHOCYTES: 29 % (ref 19–48)
MCH: 29.8 PG (ref 27–31)
MCHC: 34.2 g/dL (ref 31–37)
MCV: 87 FL (ref 78–98)
MONOCYTES: 8 % (ref 3–9)
MPV: 10 FL (ref 5.9–10.3)
NEUTROPHILS: 62 % (ref 40–74)
PLATELET: 175 10*3/uL (ref 130–400)
RBC: 5.24 M/uL (ref 4.7–6.1)
RDW: 13.6 % (ref 11.5–14.5)
WBC: 6.9 10*3/uL (ref 4.5–10.8)

## 2015-10-24 LAB — SED RATE, AUTOMATED: Sed rate, automated: 2 mm/hr (ref 0–15)

## 2015-10-24 LAB — C REACTIVE PROTEIN, QT: C-Reactive protein: 0.46 mg/dL — ABNORMAL HIGH (ref <0.32)

## 2015-10-25 LAB — FAX TO: FAX TO NUMBER: 18592635141

## 2017-02-24 ENCOUNTER — Emergency Department (HOSPITAL_BASED_OUTPATIENT_CLINIC_OR_DEPARTMENT_OTHER)
Admission: EM | Admit: 2017-02-24 | Discharge: 2017-02-24 | Disposition: A | Discharge status: Home / Self Care | Payer: Medicare Other | Attending: Emergency Medicine | Admitting: Emergency Medicine

## 2017-02-24 ENCOUNTER — Other Ambulatory Visit: Payer: Self-pay

## 2017-02-24 ENCOUNTER — Encounter (HOSPITAL_BASED_OUTPATIENT_CLINIC_OR_DEPARTMENT_OTHER): Payer: Self-pay | Admitting: Emergency Medicine

## 2017-02-24 ENCOUNTER — Emergency Department (HOSPITAL_BASED_OUTPATIENT_CLINIC_OR_DEPARTMENT_OTHER): Payer: Medicare Other

## 2017-02-24 DIAGNOSIS — Z79899 Other long term (current) drug therapy: Secondary | ICD-10-CM | POA: Diagnosis not present

## 2017-02-24 DIAGNOSIS — Z7982 Long term (current) use of aspirin: Secondary | ICD-10-CM | POA: Diagnosis not present

## 2017-02-24 DIAGNOSIS — J209 Acute bronchitis, unspecified: Secondary | ICD-10-CM | POA: Insufficient documentation

## 2017-02-24 DIAGNOSIS — F17200 Nicotine dependence, unspecified, uncomplicated: Secondary | ICD-10-CM | POA: Diagnosis not present

## 2017-02-24 DIAGNOSIS — R05 Cough: Secondary | ICD-10-CM | POA: Diagnosis present

## 2017-02-24 DIAGNOSIS — Z760 Encounter for issue of repeat prescription: Secondary | ICD-10-CM | POA: Diagnosis not present

## 2017-02-24 DIAGNOSIS — I10 Essential (primary) hypertension: Secondary | ICD-10-CM | POA: Diagnosis not present

## 2017-02-24 HISTORY — DX: Hyperlipidemia, unspecified: E78.5

## 2017-02-24 HISTORY — DX: Essential (primary) hypertension: I10

## 2017-02-24 HISTORY — DX: Gastro-esophageal reflux disease without esophagitis: K21.9

## 2017-02-24 HISTORY — DX: Dorsalgia, unspecified: M54.9

## 2017-02-24 HISTORY — DX: Other chronic pain: G89.29

## 2017-02-24 HISTORY — DX: Scoliosis, unspecified: M41.9

## 2017-02-24 MED ORDER — ALBUTEROL SULFATE HFA 108 (90 BASE) MCG/ACT IN AERS
2.0000 | INHALATION_SPRAY | RESPIRATORY_TRACT | Status: DC | PRN
  Administered 2017-02-24: 2 via RESPIRATORY_TRACT
  Filled 2017-02-24: qty 6.7

## 2017-02-24 MED ORDER — IBUPROFEN 800 MG PO TABS: 800.0000 mg | ORAL_TABLET | Freq: Three times a day (TID) | ORAL | 0 refills | Status: DC | PRN

## 2017-02-24 MED ORDER — OXYMETAZOLINE HCL 0.05 % NA SOLN
2.0000 | Freq: Two times a day (BID) | NASAL | Status: DC | PRN
  Administered 2017-02-24: 2 via NASAL
  Filled 2017-02-24: qty 15

## 2017-02-24 MED ORDER — AMITRIPTYLINE HCL 50 MG PO TABS: 50.0000 mg | ORAL_TABLET | Freq: Every day | ORAL | 0 refills | Status: DC

## 2017-02-24 MED ORDER — TIZANIDINE HCL 4 MG PO CAPS: 4.0000 mg | ORAL_CAPSULE | Freq: Four times a day (QID) | ORAL | 0 refills | Status: DC

## 2017-02-24 MED ORDER — IPRATROPIUM-ALBUTEROL 0.5-2.5 (3) MG/3ML IN SOLN
3.0000 mL | RESPIRATORY_TRACT | Status: DC
  Administered 2017-02-24: 3 mL via RESPIRATORY_TRACT
  Filled 2017-02-24: qty 3

## 2017-02-24 MED ORDER — RANITIDINE HCL 300 MG PO TABS: 300.0000 mg | ORAL_TABLET | Freq: Every day | ORAL | 0 refills | Status: DC

## 2017-02-24 MED ORDER — DEXAMETHASONE SODIUM PHOSPHATE 10 MG/ML IJ SOLN
10.0000 mg | Freq: Once | INTRAMUSCULAR | Status: AC
  Administered 2017-02-24: 10 mg via INTRAMUSCULAR
  Filled 2017-02-24: qty 1

## 2017-02-24 MED ORDER — AEROCHAMBER PLUS W/MASK MISC
1.0000 | Freq: Once | Status: AC
  Administered 2017-02-24: 1
  Filled 2017-02-24: qty 1

## 2017-02-24 MED ORDER — LOSARTAN POTASSIUM 100 MG PO TABS: 100.0000 mg | ORAL_TABLET | Freq: Every day | ORAL | 0 refills | Status: DC

## 2017-02-24 NOTE — ED Notes (Signed)
Pt discharged to home with family. NAD.  

## 2017-02-24 NOTE — ED Provider Notes (Signed)
[facility] Provider Note: Lowella Dell, MD, FACEP  CSN: [id number] MRN: [id number] ARRIVAL: 02/24/17 at 0402 ROOM: [facility]01/[facility]01   CHIEF COMPLAINT  Cough   HISTORY OF PRESENT ILLNESS  02/24/17 4:24 AM Mark Kelley is a 57 y.o. male with a 6-day history of cough productive of variously colored sputum.  He is also having increased shortness of breath, worse when lying supine or with exertion.  He has an inhaler which she has been using but states it is not adequate.  He is also having nasal congestion with green mucus.  He has not been having a fever or chills.  He has chronic back pain and his cough is exacerbating his back pain.   Past Medical History:  Diagnosis Date  . Chronic back pain   . GERD (gastroesophageal reflux disease)   . Hyperlipemia   . Hypertension   . Scoliosis     Past Surgical History:  Procedure Laterality Date  . HERNIA REPAIR    . JOINT REPLACEMENT    . KNEE ARTHROPLASTY    . SHOULDER ARTHROSCOPY      No family history on file.  Social History   Tobacco Use  . Smoking status: Current Every Day Smoker  . Smokeless tobacco: Former Engineer, water Use Topics  . Alcohol use: Yes  . Drug use: No    Prior to Admission medications   Medication Sig Start Date End Date Taking? Authorizing Provider  amitriptyline (ELAVIL) 50 MG tablet Take 50 mg by mouth at bedtime.   Yes [provider]  aspirin EC 81 MG tablet Take 81 mg by mouth daily.   Yes [provider]  gabapentin (NEURONTIN) 600 MG tablet Take 600 mg by mouth QID.   Yes [provider]  HYDROcodone-acetaminophen (NORCO) 10-325 MG tablet Take 1 tablet by mouth every 6 (six) hours as needed.   Yes [provider]  losartan (COZAAR) 100 MG tablet Take 100 mg by mouth daily.   Yes [provider]  omeprazole (PRILOSEC) 40 MG capsule Take 40 mg by mouth daily.   Yes [provider]  pravastatin (PRAVACHOL) 80 MG tablet Take 80 mg  by mouth daily.   Yes [provider]  ranitidine (ZANTAC) 300 MG tablet Take 300 mg by mouth at bedtime.   Yes [provider]  tiZANidine (ZANAFLEX) 4 MG capsule Take 4 mg by mouth QID.   Yes [provider]    Allergies Morphine and related; Robaxin [methocarbamol]; Solu-medrol [methylprednisolone]; Toradol [ketorolac tromethamine]; and Tramadol   REVIEW OF SYSTEMS  Negative except as noted here or in the History of Present Illness.   PHYSICAL EXAMINATION  Initial Vital Signs Blood pressure (!) 135/109, pulse (!) 105, temperature 97.7 F (36.5 C), temperature source Oral, resp. rate 17, height 6' (1.829 m), weight 104.3 kg (230 lb), SpO2 99 %.  Examination General: Well-developed, well-nourished male in no acute distress; appearance consistent with age of record HENT: normocephalic; atraumatic Eyes: pupils equal, round and reactive to light; extraocular muscles intact Neck: supple Heart: regular rate and rhythm Lungs: clear to auscultation bilaterally; coughing on deep breathing Abdomen: soft; nondistended; nontender; bowel sounds present Extremities: No deformity; full range of motion; pulses normal Neurologic: Awake, alert and oriented; motor function intact in all extremities and symmetric; no facial droop Skin: Warm and dry Psychiatric: Normal mood and affect   RESULTS  Summary of this visit's results, reviewed by myself:   EKG Interpretation  Date/Time:  Ventricular Rate:    PR Interval:    QRS Duration:   QT Interval:    QTC Calculation:   R Axis:     Text Interpretation:        Laboratory Studies: No results found for this or any previous visit (from the past 24 hour(s)). Imaging Studies: Dg Chest 2 View  Result Date: 02/24/2017 CLINICAL DATA:  Acute onset of cough, fever and nasal drainage. EXAM: CHEST  2 VIEW COMPARISON:  None. FINDINGS: The lungs are well-aerated. Slightly increased interstitial markings are noted. There is  no evidence of focal opacification, pleural effusion or pneumothorax. The heart is normal in size; the mediastinal contour is within normal limits. No acute osseous abnormalities are seen. The patient's right shoulder arthroplasty is grossly unremarkable, though incompletely imaged. IMPRESSION: Slightly increased interstitial markings noted. Lungs otherwise clear. Electronically Signed   By: Roanna RaiderJeffery  Chang M.D.   On: 02/24/2017 05:01    ED COURSE  Nursing notes and initial vitals signs, including pulse oximetry, reviewed.  Vitals:   02/24/17 0406 02/24/17 0409 02/24/17 0437  BP: (!) 135/109    Pulse: (!) 105    Resp: 17    Temp: 97.7 F (36.5 C)    TempSrc: Oral    SpO2: 99%  99%  Weight:  104.3 kg (230 lb)   Height:  6' (1.829 m)    5:11 AM Air movement improved, cough improved after DuoNeb treatment.  We will provide him with an AeroChamber for his albuterol inhaler and instructed him in its use.  He is requesting refills of his regular medications as he is new to town.  We will give referrals for reestablishing with a primary care physician.  He was told he will have to establish with a PCP to restart his narcotic pain medication for his chronic back pain.  PROCEDURES    ED DIAGNOSES     ICD-10-CM   1. Acute bronchitis with bronchospasm J20.9   2. Medication refill Z76.0        Mark Kelley, Mark RuizJohn, MD 02/24/17 [id number]

## 2017-02-24 NOTE — ED Triage Notes (Signed)
Pt presents with c/o cough shortness of breath runny nose for 6 days

## 2017-04-04 ENCOUNTER — Encounter (HOSPITAL_BASED_OUTPATIENT_CLINIC_OR_DEPARTMENT_OTHER): Payer: Self-pay | Admitting: Emergency Medicine

## 2017-04-04 ENCOUNTER — Emergency Department (HOSPITAL_BASED_OUTPATIENT_CLINIC_OR_DEPARTMENT_OTHER): Payer: Medicare Other

## 2017-04-04 ENCOUNTER — Other Ambulatory Visit: Payer: Self-pay

## 2017-04-04 ENCOUNTER — Emergency Department (HOSPITAL_BASED_OUTPATIENT_CLINIC_OR_DEPARTMENT_OTHER)
Admission: EM | Admit: 2017-04-04 | Discharge: 2017-04-04 | Disposition: A | Discharge status: Home / Self Care | Payer: Medicare Other | Attending: Emergency Medicine | Admitting: Emergency Medicine

## 2017-04-04 DIAGNOSIS — Z96659 Presence of unspecified artificial knee joint: Secondary | ICD-10-CM | POA: Diagnosis not present

## 2017-04-04 DIAGNOSIS — I10 Essential (primary) hypertension: Secondary | ICD-10-CM | POA: Insufficient documentation

## 2017-04-04 DIAGNOSIS — R102 Pelvic and perineal pain: Secondary | ICD-10-CM

## 2017-04-04 DIAGNOSIS — R1030 Lower abdominal pain, unspecified: Secondary | ICD-10-CM | POA: Diagnosis not present

## 2017-04-04 DIAGNOSIS — R300 Dysuria: Secondary | ICD-10-CM | POA: Diagnosis not present

## 2017-04-04 DIAGNOSIS — Z7902 Long term (current) use of antithrombotics/antiplatelets: Secondary | ICD-10-CM | POA: Insufficient documentation

## 2017-04-04 DIAGNOSIS — Z7982 Long term (current) use of aspirin: Secondary | ICD-10-CM | POA: Diagnosis not present

## 2017-04-04 DIAGNOSIS — Z79899 Other long term (current) drug therapy: Secondary | ICD-10-CM | POA: Diagnosis not present

## 2017-04-04 LAB — CBC WITH DIFFERENTIAL/PLATELET
Basophils Absolute: 0 10*3/uL (ref 0.0–0.1)
Basophils Relative: 0 %
Eosinophils Absolute: 0.1 10*3/uL (ref 0.0–0.7)
Eosinophils Relative: 1 %
HCT: 44.2 % (ref 39.0–52.0)
Hemoglobin: 15.5 g/dL (ref 13.0–17.0)
Lymphocytes Relative: 24 %
Lymphs Abs: 1.5 10*3/uL (ref 0.7–4.0)
MCH: 30.6 pg (ref 26.0–34.0)
MCHC: 35.1 g/dL (ref 30.0–36.0)
MCV: 87.2 fL (ref 78.0–100.0)
Monocytes Absolute: 0.5 10*3/uL (ref 0.1–1.0)
Monocytes Relative: 7 %
Neutro Abs: 4.3 10*3/uL (ref 1.7–7.7)
Neutrophils Relative %: 68 %
Platelets: 158 10*3/uL (ref 150–400)
RBC: 5.07 MIL/uL (ref 4.22–5.81)
RDW: 13 % (ref 11.5–15.5)
WBC: 6.3 10*3/uL (ref 4.0–10.5)

## 2017-04-04 LAB — COMPREHENSIVE METABOLIC PANEL
ALT: 44 U/L (ref 17–63)
AST: 32 U/L (ref 15–41)
Albumin: 4.1 g/dL (ref 3.5–5.0)
Alkaline Phosphatase: 77 U/L (ref 38–126)
Anion gap: 8 (ref 5–15)
BUN: 10 mg/dL (ref 6–20)
CO2: 24 mmol/L (ref 22–32)
Calcium: 9 mg/dL (ref 8.9–10.3)
Chloride: 104 mmol/L (ref 101–111)
Creatinine, Ser: 1.01 mg/dL (ref 0.61–1.24)
GFR calc Af Amer: >60 mL/min (ref >60)
GFR calc non Af Amer: >60 mL/min (ref >60)
Glucose, Bld: 127 mg/dL — ABNORMAL HIGH (ref 65–99)
Potassium: 3.9 mmol/L (ref 3.5–5.1)
Sodium: 136 mmol/L (ref 135–145)
Total Bilirubin: 0.7 mg/dL (ref 0.3–1.2)
Total Protein: 7.1 g/dL (ref 6.5–8.1)

## 2017-04-04 LAB — URINALYSIS, ROUTINE W REFLEX MICROSCOPIC
Bilirubin Urine: NEGATIVE
Glucose, UA: NEGATIVE mg/dL
HGB URINE DIPSTICK: NEGATIVE
Ketones, ur: NEGATIVE mg/dL
LEUKOCYTES UA: NEGATIVE
NITRITE: NEGATIVE
Protein, ur: NEGATIVE mg/dL
SPECIFIC GRAVITY, URINE: 1.01 (ref 1.005–1.030)
pH: 6 (ref 5.0–8.0)

## 2017-04-04 MED ORDER — PHENAZOPYRIDINE HCL 200 MG PO TABS: 200.0000 mg | ORAL_TABLET | Freq: Three times a day (TID) | ORAL | 0 refills | Status: DC

## 2017-04-04 MED ORDER — ONDANSETRON HCL 4 MG PO TABS: 4.0000 mg | ORAL_TABLET | Freq: Four times a day (QID) | ORAL | 0 refills | Status: DC

## 2017-04-04 MED ORDER — ONDANSETRON HCL 4 MG/2ML IJ SOLN
4.0000 mg | Freq: Once | INTRAMUSCULAR | Status: AC
  Administered 2017-04-04: 4 mg via INTRAVENOUS
  Filled 2017-04-04: qty 2

## 2017-04-04 MED ORDER — FENTANYL CITRATE (PF) 100 MCG/2ML IJ SOLN
50.0000 ug | Freq: Once | INTRAMUSCULAR | Status: AC
  Administered 2017-04-04: 50 ug via INTRAVENOUS
  Filled 2017-04-04: qty 2

## 2017-04-04 MED ORDER — IOPAMIDOL (ISOVUE-300) INJECTION 61%
100.0000 mL | Freq: Once | INTRAVENOUS | Status: AC | PRN
  Administered 2017-04-04: 100 mL via INTRAVENOUS

## 2017-04-04 NOTE — Discharge Instructions (Signed)
Take Pyridium 3 times daily for 2 days.  Take Zofran every 6 hours as needed for nausea or vomiting.  Please follow-up with urology as soon as possible for further evaluation and treatment of your symptoms.  Please return to the emergency department if you develop any new or worsening symptoms.

## 2017-04-04 NOTE — ED Triage Notes (Signed)
Pt c/o burning and pain "in my bladder" after urination x 2 wks

## 2017-04-04 NOTE — ED Provider Notes (Signed)
[facility] EMERGENCY DEPARTMENT Provider Note   CSN: [id number] Arrival date & time: 04/04/17  1042     History   Chief Complaint Chief Complaint  Patient presents with  . Dysuria    HPI Mark Kelley is a 57 y.o. male with history of GERD, hypertension, hyperlipidemia, chronic back pain who presents with a 2-week history of bladder pain.  Patient reports he has pain at baseline.,  But the pain is worse while urinating and after he empties his bladder.  He reports history of bilateral inguinal hernia repair and having similar pain prior when the hernia was currently "grown into his bladder" or wrapped around.  He denies any recent hematuria or penile discharge.  He denies any fever.  He has had some associated nausea, but no vomiting.  He has taken aspirin at home without relief.  HPI  Past Medical History:  Diagnosis Date  . Chronic back pain   . GERD (gastroesophageal reflux disease)   . Hyperlipemia   . Hypertension   . Scoliosis     There are no active problems to display for this patient.   Past Surgical History:  Procedure Laterality Date  . HERNIA REPAIR    . JOINT REPLACEMENT    . KNEE ARTHROPLASTY    . SHOULDER ARTHROSCOPY         Home Medications    Prior to Admission medications   Medication Sig Start Date End Date Taking? Authorizing Provider  amitriptyline (ELAVIL) 50 MG tablet Take 1 tablet (50 mg total) by mouth at bedtime. 02/24/17   Molpus, John, MD  aspirin EC 81 MG tablet Take 81 mg by mouth daily.    [provider]  gabapentin (NEURONTIN) 600 MG tablet Take 600 mg by mouth QID.    [provider]  HYDROcodone-acetaminophen (NORCO) 10-325 MG tablet Take 1 tablet by mouth every 6 (six) hours as needed.    [provider]  ibuprofen (ADVIL,MOTRIN) 800 MG tablet Take 1 tablet (800 mg total) by mouth every 8 (eight) hours as needed (for pain). 02/24/17   Molpus, John, MD  losartan (COZAAR) 100 MG tablet Take 1  tablet (100 mg total) by mouth daily. 02/24/17   Molpus, John, MD  omeprazole (PRILOSEC) 40 MG capsule Take 40 mg by mouth daily.    [provider]  ondansetron (ZOFRAN) 4 MG tablet Take 1 tablet (4 mg total) by mouth every 6 (six) hours. 04/04/17   Niveah Boerner, Waylan BogaAlexandra M, PA-C  phenazopyridine (PYRIDIUM) 200 MG tablet Take 1 tablet (200 mg total) by mouth 3 (three) times daily. 04/04/17   Jalexus Brett, Waylan BogaAlexandra M, PA-C  pravastatin (PRAVACHOL) 80 MG tablet Take 80 mg by mouth daily.    [provider]  ranitidine (ZANTAC) 300 MG tablet Take 1 tablet (300 mg total) by mouth at bedtime. 02/24/17   Molpus, John, MD  tiZANidine (ZANAFLEX) 4 MG capsule Take 1 capsule (4 mg total) by mouth QID. 02/24/17   Molpus, John, MD    Family History No family history on file.  Social History Social History   Tobacco Use  . Smoking status: Current Every Day Smoker  . Smokeless tobacco: Former Engineer, waterUser  Substance Use Topics  . Alcohol use: Yes  . Drug use: No     Allergies   Morphine and related; Robaxin [methocarbamol]; Solu-medrol [methylprednisolone]; Toradol [ketorolac tromethamine]; and Tramadol   Review of Systems Review of Systems  Constitutional: Negative for chills and fever.  HENT: Negative for facial swelling and  sore throat.   Respiratory: Negative for shortness of breath.   Cardiovascular: Negative for chest pain.  Gastrointestinal: Positive for abdominal pain (suprapubic). Negative for nausea and vomiting.  Genitourinary: Positive for dysuria. Negative for discharge, flank pain, hematuria, penile pain, penile swelling, scrotal swelling and testicular pain.  Musculoskeletal: Negative for back pain.  Skin: Negative for rash and wound.  Neurological: Negative for headaches.  Psychiatric/Behavioral: The patient is not nervous/anxious.      Physical Exam Updated Vital Signs BP (!) 144/94 (BP Location: Right Arm)   Pulse 80   Temp 98 F (36.7 C) (Oral)   Resp 18   Ht 6' (1.829 m)    Wt 106.6 kg (235 lb)   SpO2 98%   BMI 31.87 kg/m   Physical Exam  Constitutional: He appears well-developed and well-nourished. No distress.  HENT:  Head: Normocephalic and atraumatic.  Mouth/Throat: Oropharynx is clear and moist. No oropharyngeal exudate.  Eyes: Conjunctivae are normal. Pupils are equal, round, and reactive to light. Right eye exhibits no discharge. Left eye exhibits no discharge. No scleral icterus.  Neck: Normal range of motion. Neck supple. No thyromegaly present.  Cardiovascular: Normal rate, regular rhythm, normal heart sounds and intact distal pulses. Exam reveals no gallop and no friction rub.  No murmur heard. Pulmonary/Chest: Effort normal and breath sounds normal. No stridor. No respiratory distress. He has no wheezes. He has no rales.  Abdominal: Soft. Bowel sounds are normal. He exhibits no distension. There is tenderness in the right lower quadrant, suprapubic area and left lower quadrant. There is no rebound, no guarding and no CVA tenderness. Hernia confirmed negative in the right inguinal area and confirmed negative in the left inguinal area.  Genitourinary: Testes normal and penis normal. Uncircumcised.  Genitourinary Comments: Prostate exam conducted by Dr. Ethelda Chick - see his note for exam notes  Musculoskeletal: He exhibits no edema.  Lymphadenopathy:    He has no cervical adenopathy.  Neurological: He is alert. Coordination normal.  Skin: Skin is warm and dry. No rash noted. He is not diaphoretic. No pallor.  Psychiatric: He has a normal mood and affect.  Nursing note and vitals reviewed.    ED Treatments / Results  Labs (all labs ordered are listed, but only abnormal results are displayed) Labs Reviewed  COMPREHENSIVE METABOLIC PANEL - Abnormal; Notable for the following components:      Result Value   Glucose, Bld 127 (*)    All other components within normal limits  URINALYSIS, ROUTINE W REFLEX MICROSCOPIC  CBC WITH DIFFERENTIAL/PLATELET     EKG  EKG Interpretation None       Radiology Ct Abdomen Pelvis W Contrast  Result Date: 04/04/2017 CLINICAL DATA:  Acute generalized abdominal pain, dysuria, nausea for 1 day. EXAM: CT ABDOMEN AND PELVIS WITH CONTRAST TECHNIQUE: Multidetector CT imaging of the abdomen and pelvis was performed using the standard protocol following bolus administration of intravenous contrast. CONTRAST:  ISOVUE-300 IOPAMIDOL (ISOVUE-300) INJECTION 61% COMPARISON:  None. FINDINGS: Lower chest: No acute abnormality Hepatobiliary: Probable mild hepatic steatosis noted. No other hepatic abnormalities are identified. The gallbladder is unremarkable. No biliary dilatation. Pancreas: Unremarkable Spleen: Unremarkable Adrenals/Urinary Tract: The kidneys, adrenal glands and bladder are unremarkable. There is no evidence of hydronephrosis or urinary calculi. Stomach/Bowel: Stomach is within normal limits. Appendix appears normal. No evidence of bowel wall thickening, distention, or inflammatory changes. Vascular/Lymphatic: Aortic atherosclerosis. No enlarged abdominal or pelvic lymph nodes. Reproductive: Prostate is unremarkable. Other: No ascites, abscess or pneumoperitoneum. A  inguinal hernia repair noted. A small left inguinal hernia containing fat is present. Musculoskeletal: No acute or significant osseous findings. Lumbar scoliosis and moderate degenerative disc disease at L5-S1 noted. IMPRESSION: 1. No acute abnormality or findings to suggest a cause for this patient's abdominal pain. 2. Probable mild hepatic steatosis 3. Small left inguinal hernia containing fat 4.  Aortic Atherosclerosis (ICD10-I70.0). Electronically Signed   By: Harmon Pier M.D.   On: 04/04/2017 13:01    Procedures Procedures (including critical care time)  Medications Ordered in ED Medications  fentaNYL (SUBLIMAZE) injection 50 mcg (50 mcg Intravenous Given 04/04/17 1230)  ondansetron (ZOFRAN) injection 4 mg (4 mg Intravenous Given 04/04/17  1228)  iopamidol (ISOVUE-300) 61 % injection 100 mL (100 mLs Intravenous Contrast Given 04/04/17 1238)     Initial Impression / Assessment and Plan / ED Course  I have reviewed the triage vital signs and the nursing notes.  Pertinent labs & imaging results that were available during my care of the patient were reviewed by me and considered in my medical decision making (see chart for details).     Patient with 2 weeks history of suprapubic pain and burning.  CBC, CMP unremarkable.  UA is negative.  CT of the pelvis is negative for acute findings, except small fat-containing hernia and probable mild hepatic steatosis.  Patient has follow-up with urology for further workup, however patient advised to call to be put on cancellation list.  Discharge home with Pyridium for 2 days and Zofran.  Return precautions discussed.  Patient understands and agrees with plan.  Patient vitals stable and discharged in satisfactory condition.  Patient also evaluated by Dr. Ethelda Chick who guided the patient's management and agrees with plan.  Final Clinical Impressions(s) / ED Diagnoses   Final diagnoses:  Dysuria  Suprapubic pain    ED Discharge Orders        Ordered    phenazopyridine (PYRIDIUM) 200 MG tablet  3 times daily     04/04/17 1356    ondansetron (ZOFRAN) 4 MG tablet  Every 6 hours     04/04/17 [address] PA-C 04/04/17 1746    Doug Sou, MD 04/05/17 [phone]

## 2017-04-04 NOTE — ED Provider Notes (Signed)
Complains of burning after urination at suprapubic area for the past 2 weeks accompanied by nausea.  He denies any fever.  Denies difficulty with urinary stream or penile discharge.  No other associated symptoms.  On exam alert nontoxic abdomen tender at bilateral lower quadrants and suprapubic area rectum normal tone nontender prostate grossly normal.  Penis uncircumcised.  No discharge scrotum is normal   Doug SouJacubowitz, Ravan Schlemmer, MD 04/04/17 1231

## 2017-04-20 ENCOUNTER — Other Ambulatory Visit: Payer: Self-pay

## 2017-04-20 ENCOUNTER — Encounter (HOSPITAL_BASED_OUTPATIENT_CLINIC_OR_DEPARTMENT_OTHER): Payer: Self-pay | Admitting: *Deleted

## 2017-04-20 ENCOUNTER — Emergency Department (HOSPITAL_BASED_OUTPATIENT_CLINIC_OR_DEPARTMENT_OTHER): Payer: Medicare Other

## 2017-04-20 ENCOUNTER — Observation Stay (HOSPITAL_BASED_OUTPATIENT_CLINIC_OR_DEPARTMENT_OTHER)
Admission: EM | Admit: 2017-04-20 | Discharge: 2017-04-21 | Disposition: A | Discharge status: Home / Self Care | Payer: Medicare Other | Attending: Family Medicine | Admitting: Family Medicine

## 2017-04-20 DIAGNOSIS — R9431 Abnormal electrocardiogram [ECG] [EKG]: Secondary | ICD-10-CM | POA: Diagnosis not present

## 2017-04-20 DIAGNOSIS — E785 Hyperlipidemia, unspecified: Secondary | ICD-10-CM | POA: Diagnosis not present

## 2017-04-20 DIAGNOSIS — I700 Atherosclerosis of aorta: Secondary | ICD-10-CM | POA: Diagnosis not present

## 2017-04-20 DIAGNOSIS — M549 Dorsalgia, unspecified: Secondary | ICD-10-CM | POA: Diagnosis not present

## 2017-04-20 DIAGNOSIS — K219 Gastro-esophageal reflux disease without esophagitis: Secondary | ICD-10-CM | POA: Insufficient documentation

## 2017-04-20 DIAGNOSIS — Z7982 Long term (current) use of aspirin: Secondary | ICD-10-CM | POA: Diagnosis not present

## 2017-04-20 DIAGNOSIS — Z9660 Presence of unspecified orthopedic joint implant: Secondary | ICD-10-CM | POA: Diagnosis not present

## 2017-04-20 DIAGNOSIS — F17200 Nicotine dependence, unspecified, uncomplicated: Secondary | ICD-10-CM | POA: Insufficient documentation

## 2017-04-20 DIAGNOSIS — M545 Low back pain: Secondary | ICD-10-CM

## 2017-04-20 DIAGNOSIS — Z79899 Other long term (current) drug therapy: Secondary | ICD-10-CM | POA: Diagnosis not present

## 2017-04-20 DIAGNOSIS — R112 Nausea with vomiting, unspecified: Secondary | ICD-10-CM | POA: Insufficient documentation

## 2017-04-20 DIAGNOSIS — N179 Acute kidney failure, unspecified: Principal | ICD-10-CM | POA: Diagnosis present

## 2017-04-20 DIAGNOSIS — R1031 Right lower quadrant pain: Secondary | ICD-10-CM | POA: Insufficient documentation

## 2017-04-20 DIAGNOSIS — I10 Essential (primary) hypertension: Secondary | ICD-10-CM | POA: Diagnosis not present

## 2017-04-20 DIAGNOSIS — R109 Unspecified abdominal pain: Secondary | ICD-10-CM | POA: Diagnosis present

## 2017-04-20 DIAGNOSIS — G8929 Other chronic pain: Secondary | ICD-10-CM | POA: Diagnosis not present

## 2017-04-20 LAB — CBC
HEMATOCRIT: 40.6 % (ref 39.0–52.0)
Hemoglobin: 13.8 g/dL (ref 13.0–17.0)
MCH: 30.2 pg (ref 26.0–34.0)
MCHC: 34 g/dL (ref 30.0–36.0)
MCV: 88.8 fL (ref 78.0–100.0)
Platelets: 192 10*3/uL (ref 150–400)
RBC: 4.57 MIL/uL (ref 4.22–5.81)
RDW: 13.4 % (ref 11.5–15.5)
WBC: 5.5 10*3/uL (ref 4.0–10.5)

## 2017-04-20 LAB — URINALYSIS, ROUTINE W REFLEX MICROSCOPIC
BILIRUBIN URINE: NEGATIVE
GLUCOSE, UA: 100 mg/dL — AB
HGB URINE DIPSTICK: NEGATIVE
KETONES UR: 15 mg/dL — AB
Leukocytes, UA: NEGATIVE
Nitrite: NEGATIVE
Protein, ur: NEGATIVE mg/dL
Specific Gravity, Urine: 1.025 (ref 1.005–1.030)
pH: 5.5 (ref 5.0–8.0)

## 2017-04-20 LAB — CBC WITH DIFFERENTIAL/PLATELET
Basophils Absolute: 0 10*3/uL (ref 0.0–0.1)
Basophils Relative: 0 %
EOS ABS: 0.1 10*3/uL (ref 0.0–0.7)
Eosinophils Relative: 1 %
HEMATOCRIT: 39.3 % (ref 39.0–52.0)
HEMOGLOBIN: 13.6 g/dL (ref 13.0–17.0)
LYMPHS ABS: 2 10*3/uL (ref 0.7–4.0)
Lymphocytes Relative: 24 %
MCH: 30.6 pg (ref 26.0–34.0)
MCHC: 34.6 g/dL (ref 30.0–36.0)
MCV: 88.3 fL (ref 78.0–100.0)
Monocytes Absolute: 1 10*3/uL (ref 0.1–1.0)
Monocytes Relative: 12 %
NEUTROS ABS: 5.2 10*3/uL (ref 1.7–7.7)
NEUTROS PCT: 63 %
Platelets: 199 10*3/uL (ref 150–400)
RBC: 4.45 MIL/uL (ref 4.22–5.81)
RDW: 13.6 % (ref 11.5–15.5)
WBC: 8.4 10*3/uL (ref 4.0–10.5)

## 2017-04-20 LAB — COMPREHENSIVE METABOLIC PANEL
ALK PHOS: 75 U/L (ref 38–126)
ALT: 34 U/L (ref 17–63)
AST: 35 U/L (ref 15–41)
Albumin: 4.1 g/dL (ref 3.5–5.0)
Anion gap: 11 (ref 5–15)
BILIRUBIN TOTAL: 0.8 mg/dL (ref 0.3–1.2)
BUN: 35 mg/dL — ABNORMAL HIGH (ref 6–20)
CALCIUM: 8.7 mg/dL — AB (ref 8.9–10.3)
CO2: 25 mmol/L (ref 22–32)
CREATININE: 3.24 mg/dL — AB (ref 0.61–1.24)
Chloride: 97 mmol/L — ABNORMAL LOW (ref 101–111)
GFR calc non Af Amer: 20 mL/min — ABNORMAL LOW (ref >60)
GFR, EST AFRICAN AMERICAN: 23 mL/min — AB (ref >60)
GLUCOSE: 109 mg/dL — AB (ref 65–99)
Potassium: 3.9 mmol/L (ref 3.5–5.1)
SODIUM: 133 mmol/L — AB (ref 135–145)
TOTAL PROTEIN: 6.8 g/dL (ref 6.5–8.1)

## 2017-04-20 LAB — CREATININE, SERUM
Creatinine, Ser: 1.62 mg/dL — ABNORMAL HIGH (ref 0.61–1.24)
GFR calc non Af Amer: 46 mL/min — ABNORMAL LOW (ref >60)
GFR, EST AFRICAN AMERICAN: 53 mL/min — AB (ref >60)

## 2017-04-20 LAB — TROPONIN I: Troponin I: <0.03 ng/mL (ref <0.03)

## 2017-04-20 MED ORDER — FAMOTIDINE 20 MG PO TABS
20.0000 mg | ORAL_TABLET | Freq: Every day | ORAL | Status: DC
  Administered 2017-04-20: 20 mg via ORAL
  Filled 2017-04-20: qty 1

## 2017-04-20 MED ORDER — ZOLPIDEM TARTRATE 10 MG PO TABS
10.0000 mg | ORAL_TABLET | Freq: Every evening | ORAL | Status: DC | PRN
  Administered 2017-04-20: 10 mg via ORAL
  Filled 2017-04-20: qty 1

## 2017-04-20 MED ORDER — ONDANSETRON HCL 4 MG PO TABS: 4.0000 mg | ORAL_TABLET | Freq: Four times a day (QID) | ORAL | Status: DC | PRN

## 2017-04-20 MED ORDER — ACETAMINOPHEN 500 MG PO TABS: 1000.0000 mg | ORAL_TABLET | Freq: Four times a day (QID) | ORAL | Status: DC | PRN

## 2017-04-20 MED ORDER — SODIUM CHLORIDE 0.9 % IV BOLUS (SEPSIS)
500.0000 mL | Freq: Once | INTRAVENOUS | Status: AC
  Administered 2017-04-20: 500 mL via INTRAVENOUS

## 2017-04-20 MED ORDER — ASPIRIN 81 MG PO CHEW
CHEWABLE_TABLET | ORAL | Status: AC
  Administered 2017-04-20: 81 mg
  Filled 2017-04-20: qty 1

## 2017-04-20 MED ORDER — FENTANYL CITRATE (PF) 100 MCG/2ML IJ SOLN
100.0000 ug | Freq: Once | INTRAMUSCULAR | Status: AC
  Administered 2017-04-20: 100 ug via INTRAVENOUS
  Filled 2017-04-20: qty 2

## 2017-04-20 MED ORDER — RANITIDINE HCL 150 MG/10ML PO SYRP
300.0000 mg | ORAL_SOLUTION | Freq: Every day | ORAL | Status: DC
  Administered 2017-04-20: 300 mg via ORAL
  Filled 2017-04-20: qty 20

## 2017-04-20 MED ORDER — SUCRALFATE 1 G PO TABS
1.0000 g | ORAL_TABLET | Freq: Two times a day (BID) | ORAL | Status: DC
  Administered 2017-04-20 – 2017-04-21 (×2): 1 g via ORAL
  Filled 2017-04-20 (×2): qty 1

## 2017-04-20 MED ORDER — HYDROMORPHONE HCL 1 MG/ML IJ SOLN
1.0000 mg | Freq: Once | INTRAMUSCULAR | Status: AC
  Administered 2017-04-20: 1 mg via INTRAVENOUS
  Filled 2017-04-20: qty 1

## 2017-04-20 MED ORDER — GABAPENTIN 300 MG PO CAPS
300.0000 mg | ORAL_CAPSULE | Freq: Once | ORAL | Status: AC
Start: 2017-04-20 — End: 2017-04-20
  Administered 2017-04-20: 300 mg via ORAL
  Filled 2017-04-20: qty 1

## 2017-04-20 MED ORDER — OXYCODONE HCL 5 MG PO TABS
30.0000 mg | ORAL_TABLET | Freq: Four times a day (QID) | ORAL | Status: DC | PRN
  Administered 2017-04-20 – 2017-04-21 (×3): 30 mg via ORAL
  Filled 2017-04-20 (×3): qty 6

## 2017-04-20 MED ORDER — FENTANYL CITRATE (PF) 100 MCG/2ML IJ SOLN
50.0000 ug | Freq: Once | INTRAMUSCULAR | Status: AC
  Administered 2017-04-20: 50 ug via INTRAVENOUS
  Filled 2017-04-20: qty 2

## 2017-04-20 MED ORDER — PANTOPRAZOLE SODIUM 40 MG PO TBEC
40.0000 mg | DELAYED_RELEASE_TABLET | Freq: Every day | ORAL | Status: DC
  Administered 2017-04-20 – 2017-04-21 (×2): 40 mg via ORAL
  Filled 2017-04-20 (×2): qty 1

## 2017-04-20 MED ORDER — NICOTINE POLACRILEX 2 MG MT GUM
2.0000 mg | CHEWING_GUM | OROMUCOSAL | Status: DC | PRN
Start: 2017-04-20 — End: 2017-04-20
  Filled 2017-04-20: qty 1

## 2017-04-20 MED ORDER — ONDANSETRON HCL 4 MG/2ML IJ SOLN
4.0000 mg | Freq: Once | INTRAMUSCULAR | Status: AC
  Administered 2017-04-20: 4 mg via INTRAVENOUS
  Filled 2017-04-20: qty 2

## 2017-04-20 MED ORDER — HEPARIN SODIUM (PORCINE) 5000 UNIT/ML IJ SOLN
5000.0000 [IU] | Freq: Three times a day (TID) | INTRAMUSCULAR | Status: DC
  Administered 2017-04-20 – 2017-04-21 (×2): 5000 [IU] via SUBCUTANEOUS
  Filled 2017-04-20 (×2): qty 1

## 2017-04-20 MED ORDER — SODIUM CHLORIDE 0.9 % IV BOLUS (SEPSIS)
1000.0000 mL | Freq: Once | INTRAVENOUS | Status: AC
  Administered 2017-04-20: 1000 mL via INTRAVENOUS

## 2017-04-20 MED ORDER — ALBUTEROL SULFATE HFA 108 (90 BASE) MCG/ACT IN AERS
2.0000 | INHALATION_SPRAY | RESPIRATORY_TRACT | Status: DC | PRN
  Administered 2017-04-20: 2 via RESPIRATORY_TRACT
  Filled 2017-04-20: qty 6.7

## 2017-04-20 MED ORDER — AMITRIPTYLINE HCL 25 MG PO TABS
50.0000 mg | ORAL_TABLET | Freq: Every day | ORAL | Status: DC
  Administered 2017-04-20: 50 mg via ORAL
  Filled 2017-04-20: qty 2

## 2017-04-20 MED ORDER — ALBUTEROL SULFATE (2.5 MG/3ML) 0.083% IN NEBU: 2.5000 mg | INHALATION_SOLUTION | RESPIRATORY_TRACT | Status: DC | PRN

## 2017-04-20 MED ORDER — ONDANSETRON HCL 4 MG/2ML IJ SOLN: 4.0000 mg | Freq: Four times a day (QID) | INTRAMUSCULAR | Status: DC | PRN

## 2017-04-20 MED ORDER — LOSARTAN POTASSIUM 50 MG PO TABS
100.0000 mg | ORAL_TABLET | Freq: Every day | ORAL | Status: DC
  Filled 2017-04-20: qty 2

## 2017-04-20 MED ORDER — ASPIRIN EC 81 MG PO TBEC
81.0000 mg | DELAYED_RELEASE_TABLET | Freq: Every day | ORAL | Status: DC
  Administered 2017-04-21: 81 mg via ORAL
  Filled 2017-04-20: qty 1

## 2017-04-20 MED ORDER — ACETAMINOPHEN 325 MG PO TABS
650.0000 mg | ORAL_TABLET | Freq: Once | ORAL | Status: AC
  Administered 2017-04-20: 650 mg via ORAL
  Filled 2017-04-20: qty 2

## 2017-04-20 MED ORDER — SODIUM CHLORIDE 0.9 % IV SOLN
INTRAVENOUS | Status: DC
  Administered 2017-04-20: 18:00:00 via INTRAVENOUS

## 2017-04-20 MED ORDER — PRAVASTATIN SODIUM 40 MG PO TABS
80.0000 mg | ORAL_TABLET | Freq: Every day | ORAL | Status: DC
  Administered 2017-04-21: 80 mg via ORAL
  Filled 2017-04-20: qty 2
  Filled 2017-04-20: qty 1

## 2017-04-20 MED ORDER — TIZANIDINE HCL 4 MG PO TABS
4.0000 mg | ORAL_TABLET | Freq: Four times a day (QID) | ORAL | Status: DC | PRN
  Administered 2017-04-20: 4 mg via ORAL
  Filled 2017-04-20: qty 1

## 2017-04-20 NOTE — ED Notes (Signed)
To CT scan

## 2017-04-20 NOTE — ED Notes (Addendum)
Pt's O2 sats are ranging from 88-98% Pt states that he is a smoker. Denies any sob. resp even and unlabored. MD aware.

## 2017-04-20 NOTE — ED Triage Notes (Signed)
Pt states he woke up to right lower abd pain. States he has vomited 6 times pta. Denies any diarrhea. Denies any fevers. Denies any urinary issues. Describes pain is sharp and states constant.

## 2017-04-20 NOTE — ED Provider Notes (Signed)
[facility] EMERGENCY DEPARTMENT Provider Note   CSN: [id number] Arrival date & time: 04/20/17  0410     History   Chief Complaint Chief Complaint  Patient presents with  . Abdominal Pain    HPI Mark Kelley is a 57 y.o. male.  HPI  A 57 year old male with a history of back pain, hypertension, hyperlipidemia who presents with acute onset of right-sided abdominal pain.  Patient reports that pain woke him up from sleep.  He reports that it is sharp and nonradiating.  It is currently 10 out of 10.  He did not take anything for his pain.  He did have vomiting prior to arrival.  No diarrhea.  No history of constipation.  He has never had similar pain before.  He does report a recent history of hematuria.  He is being worked up by his primary physician.  He was also seen in early February for some dysuria.  He was treated symptomatically.  Patient reports that he is due to see urology.  He reports recent history of dribbling.  No decreased urinary output just sometimes difficulty initiating stream.  Past Medical History:  Diagnosis Date  . Chronic back pain   . GERD (gastroesophageal reflux disease)   . Hyperlipemia   . Hypertension   . Scoliosis     Patient Active Problem List   Diagnosis Date Noted  . AKI (acute kidney injury) (HCC) 04/20/2017    Past Surgical History:  Procedure Laterality Date  . HERNIA REPAIR    . JOINT REPLACEMENT    . KNEE ARTHROPLASTY    . SHOULDER ARTHROSCOPY         Home Medications    Prior to Admission medications   Medication Sig Start Date End Date Taking? Authorizing Provider  amitriptyline (ELAVIL) 50 MG tablet Take 1 tablet (50 mg total) by mouth at bedtime. 02/24/17   Molpus, John, MD  aspirin EC 81 MG tablet Take 81 mg by mouth daily.    [provider]  gabapentin (NEURONTIN) 600 MG tablet Take 600 mg by mouth QID.    [provider]  HYDROcodone-acetaminophen (NORCO) 10-325 MG tablet Take 1 tablet by  mouth every 6 (six) hours as needed.    [provider]  ibuprofen (ADVIL,MOTRIN) 800 MG tablet Take 1 tablet (800 mg total) by mouth every 8 (eight) hours as needed (for pain). 02/24/17   Molpus, John, MD  losartan (COZAAR) 100 MG tablet Take 1 tablet (100 mg total) by mouth daily. 02/24/17   Molpus, John, MD  omeprazole (PRILOSEC) 40 MG capsule Take 40 mg by mouth daily.    [provider]  ondansetron (ZOFRAN) 4 MG tablet Take 1 tablet (4 mg total) by mouth every 6 (six) hours. 04/04/17   Law, Waylan Boga, PA-C  phenazopyridine (PYRIDIUM) 200 MG tablet Take 1 tablet (200 mg total) by mouth 3 (three) times daily. 04/04/17   Law, Waylan Boga, PA-C  pravastatin (PRAVACHOL) 80 MG tablet Take 80 mg by mouth daily.    [provider]  ranitidine (ZANTAC) 300 MG tablet Take 1 tablet (300 mg total) by mouth at bedtime. 02/24/17   Molpus, John, MD  tiZANidine (ZANAFLEX) 4 MG capsule Take 1 capsule (4 mg total) by mouth QID. 02/24/17   Molpus, John, MD    Family History No family history on file.  Social History Social History   Tobacco Use  . Smoking status: Current Every Day Smoker  . Smokeless tobacco: Former Engineer, water  Use Topics  . Alcohol use: Yes    Comment: occasional   . Drug use: No     Allergies   Morphine and related; Robaxin [methocarbamol]; Solu-medrol [methylprednisolone]; Toradol [ketorolac tromethamine]; and Tramadol   Review of Systems Review of Systems  Constitutional: Negative for fever.  Respiratory: Negative for shortness of breath.   Cardiovascular: Negative for chest pain.  Gastrointestinal: Positive for abdominal pain, nausea and vomiting. Negative for diarrhea.  Genitourinary: Positive for dysuria and hematuria.  Musculoskeletal: Negative for back pain.  All other systems reviewed and are negative.    Physical Exam Updated Vital Signs BP 117/78   Pulse 100   Temp 98.7 F (37.1 C) (Oral)   Resp 20   Ht 6' (1.829 m)   Wt 106.6 kg  (235 lb)   SpO2 94%   BMI 31.87 kg/m   Physical Exam  Constitutional: He is oriented to person, place, and time. He appears well-developed and well-nourished. No distress.  Overweight  HENT:  Head: Normocephalic and atraumatic.  Neck: Neck supple.  Cardiovascular: Normal rate, regular rhythm and normal heart sounds.  No murmur heard. Pulmonary/Chest: Effort normal. No respiratory distress. He has wheezes.  Abdominal: Soft. Normal appearance and bowel sounds are normal. There is tenderness in the right upper quadrant, right lower quadrant and periumbilical area. There is no rebound and no guarding.  Genitourinary:  Genitourinary Comments: No CVA tenderness  Musculoskeletal: He exhibits no edema.  Lymphadenopathy:    He has no cervical adenopathy.  Neurological: He is alert and oriented to person, place, and time.  Skin: Skin is warm and dry.  Psychiatric: He has a normal mood and affect.  Nursing note and vitals reviewed.    ED Treatments / Results  Labs (all labs ordered are listed, but only abnormal results are displayed) Labs Reviewed  COMPREHENSIVE METABOLIC PANEL - Abnormal; Notable for the following components:      Result Value   Sodium 133 (*)    Chloride 97 (*)    Glucose, Bld 109 (*)    BUN 35 (*)    Creatinine, Ser 3.24 (*)    Calcium 8.7 (*)    GFR calc non Af Amer 20 (*)    GFR calc Af Amer 23 (*)    All other components within normal limits  URINALYSIS, ROUTINE W REFLEX MICROSCOPIC - Abnormal; Notable for the following components:   APPearance HAZY (*)    Glucose, UA 100 (*)    Ketones, ur 15 (*)    All other components within normal limits  CBC WITH DIFFERENTIAL/PLATELET    EKG  EKG Interpretation  Date/Time:  Monday April 20 2017 05:01:36 EST Ventricular Rate:  106 PR Interval:    QRS Duration: 107 QT Interval:  333 QTC Calculation: 443 R Axis:   -36 Text Interpretation:  Sinus tachycardia Left axis deviation Borderline ST elevation,  anterior leads No prior for comparison Confirmed by Ross Marcus ([id number]) on 04/20/2017 6:33:37 AM       Radiology Ct Renal Stone Study  Result Date: 04/20/2017 CLINICAL DATA:  57 year old male with flank pain. Right lower abdominal pain and hematuria. EXAM: CT ABDOMEN AND PELVIS WITHOUT CONTRAST TECHNIQUE: Multidetector CT imaging of the abdomen and pelvis was performed following the standard protocol without IV contrast. COMPARISON:  Abdominal CT dated 04/04/2017 FINDINGS: Evaluation of this exam is limited in the absence of intravenous contrast. Lower chest: Bibasilar dependent atelectatic changes. Partially visualized 7 mm subpleural nodule in the lingula (series 2, image  1). No intra-abdominal free air or free fluid. Hepatobiliary: Diffuse fatty infiltration of the liver. No intrahepatic biliary ductal dilatation. The gallbladder is unremarkable. Pancreas: Unremarkable. No pancreatic ductal dilatation or surrounding inflammatory changes. Spleen: Normal in size without focal abnormality. Adrenals/Urinary Tract: The adrenal glands are unremarkable. Subcentimeter exophytic hypodense lesion in the upper pole of the right kidney is too small to characterize. The kidneys, visualized ureters, and urinary bladder are otherwise unremarkable. Stomach/Bowel: There is moderate stool throughout the colon. No bowel obstruction or active inflammation. Normal appendix. Vascular/Lymphatic: Mild aortoiliac atherosclerotic disease. The abdominal aorta and IVC are otherwise unremarkable on this noncontrast CT. No portal venous gas. There is no adenopathy. Reproductive: The prostate and seminal vesicles are grossly unremarkable. Other: Right inguinal hernia repair. Musculoskeletal: Degenerative changes of the spine. L5-S1 disc desiccation and vacuum phenomena. Multilevel lower lumbar facet arthropathy. No acute osseous pathology. IMPRESSION: 1. No acute intra-abdominal or pelvic pathology. 2. Fatty liver. 3. Mild Aortic  Atherosclerosis (ICD10-I70.0). Electronically Signed   By: Elgie CollardArash  Radparvar M.D.   On: 04/20/2017 06:28    Procedures Procedures (including critical care time)  Medications Ordered in ED Medications  ondansetron (ZOFRAN) injection 4 mg (4 mg Intravenous Given 04/20/17 0532)  fentaNYL (SUBLIMAZE) injection 100 mcg (100 mcg Intravenous Given 04/20/17 0549)  sodium chloride 0.9 % bolus 1,000 mL (1,000 mLs Intravenous New Bag/Given 04/20/17 0653)  HYDROmorphone (DILAUDID) injection 1 mg (1 mg Intravenous Given 04/20/17 0656)     Initial Impression / Assessment and Plan / ED Course  I have reviewed the triage vital signs and the nursing notes.  Pertinent labs & imaging results that were available during my care of the patient were reviewed by me and considered in my medical decision making (see chart for details).     Patient presents with abdominal pain.  He is nontoxic appearing on exam but uncomfortable.  He has some tenderness without signs of peritonitis.  Initial blood pressure was documented as 91 systolic.  On my initial evaluation was 107.  Repeat blood pressure 117/78.  He is afebrile.  Given acuity of pain, high suspicion for kidney stones.  Other considerations include gallbladder pathology, renal infarct, less likely appendicitis given timeframe.  CT renal stone study obtained.  Largely unremarkable including commenting on a normal appendix.  Lab work shows no evidence of leukocytosis.  He does have acute kidney injury with a creatinine of 3.  Baseline 1.  This is over the last 3 weeks.  Patient has had some in urinary frequency, dribbling.  Denies any nephrotoxic drugs.  Denies any history of significant dehydration.  Given the severity of his acute kidney injury, feel like he needs admission for hydration and repeat renal function check.  Regarding his abdominal pain, CT is reassuring.  Vascular structures appear intact.  Patient treated symptomatically.  She discussed with Dr. Onalee Huaavid.   [facility]   Final Clinical Impressions(s) / ED Diagnoses   Final diagnoses:  Acute kidney injury [facility](HCC)  Right lower quadrant abdominal pain    ED Discharge Orders    None       Shon BatonHorton, Mishika Flippen F, MD 04/20/17 [phone]

## 2017-04-20 NOTE — ED Notes (Addendum)
Desat into 80s while asleep, placed on 2l.m Spur. SpO2 93%

## 2017-04-20 NOTE — H&P (Addendum)
TRH H&P    Patient Demographics:    Mark HurstDean Dubach, is a 57 y.o. male  MRN: [id number]  DOB - 10/28/1960  Admit Date - 04/20/2017    [facility]  [facility]  Chief complaint-abdominal pain   HPI:    Mark Kelley  is a 57 y.o. male,With history of chronic back pain on chronic opioids, hypertension, hyperlipidemia came to hospital with acute onset of right-sided abdominal pain.  Patient says that the pain woke him up from the sleep.  He also had 4 episodes of vomiting yesterday.  Denies diarrhea or constipation.  Patient had chest pain 2 days ago which resolved after he had large burp. Patient does have history of urinary dribbling, he does have previous history of hematuria after he had trauma to right kidney as a teenager. He had intermittent symptoms of urinary hesitancy. He currently denies chest pain, no shortness of breath. Denies any fever or chills. No dysuria In the ED, CT of the abdomen pelvis was done which showed no significant abnormality. Lab work revealed acute kidney injury with creatinine 3.24, patient baseline creatinine around 1.                                        Review of systems:      All other systems reviewed and are negative.   With Past History of the following :    Past Medical History:  Diagnosis Date  . Chronic back pain   . GERD (gastroesophageal reflux disease)   . Hyperlipemia   . Hypertension   . Scoliosis       Past Surgical History:  Procedure Laterality Date  . HERNIA REPAIR    . JOINT REPLACEMENT    . KNEE ARTHROPLASTY    . SHOULDER ARTHROSCOPY        Social History:      Social History   Tobacco Use  . Smoking status: Current Every Day Smoker  . Smokeless tobacco: Former Engineer, [employer]  Substance Use Topics  . Alcohol use: Yes    Comment: occasional        Family History :   Patient's mother had a brain tumor   Home Medications:   Prior to Admission medications   Medication Sig Start Date End Date Taking? Authorizing Provider  acetaminophen (TYLENOL) 500 MG tablet Take 1,500 mg by mouth daily as needed for headache.   Yes [provider]  amitriptyline (ELAVIL) 50 MG tablet Take 1 tablet (50 mg total) by mouth at bedtime. 02/24/17  Yes Molpus, John, MD  aspirin EC 81 MG tablet Take 81 mg by mouth daily.   Yes [provider]  Cholecalciferol (VITAMIN D PO) Take 1 tablet by mouth.   Yes [provider]  gabapentin (NEURONTIN) 600 MG tablet Take 600 mg by mouth QID.   Yes [provider]  losartan (COZAAR) 100 MG  tablet Take 1 tablet (100 mg total) by mouth daily. 02/24/17  Yes Molpus, John, MD  omeprazole (PRILOSEC) 40 MG capsule Take 40 mg by mouth daily.   Yes [provider]  ondansetron (ZOFRAN) 4 MG tablet Take 1 tablet (4 mg total) by mouth every 6 (six) hours. 04/04/17  Yes Law, Waylan Boga, PA-C  oxycodone (ROXICODONE) 30 MG immediate release tablet Take 30 mg by mouth 4 (four) times daily as needed for severe pain. 04/15/17  Yes [provider]  pravastatin (PRAVACHOL) 80 MG tablet Take 80 mg by mouth daily.   Yes [provider]  ranitidine (ZANTAC) 300 MG tablet Take 1 tablet (300 mg total) by mouth at bedtime. 02/24/17  Yes Molpus, John, MD  sucralfate (CARAFATE) 1 g tablet Take 1 g by mouth 2 (two) times daily. 04/12/17  Yes [provider]  tiZANidine (ZANAFLEX) 4 MG capsule Take 1 capsule (4 mg total) by mouth QID. 02/24/17  Yes Molpus, John, MD  zolpidem (AMBIEN) 10 MG tablet Take 10 mg by mouth at bedtime as needed for sleep. 04/02/17  Yes [provider]  ibuprofen (ADVIL,MOTRIN) 800 MG tablet Take 1 tablet (800 mg total) by mouth every 8 (eight) hours as needed (for pain). Patient not taking: Reported on 04/20/2017 02/24/17   Molpus, Jonny Ruiz, MD  phenazopyridine  (PYRIDIUM) 200 MG tablet Take 1 tablet (200 mg total) by mouth 3 (three) times daily. Patient not taking: Reported on 04/20/2017 04/04/17   Emi Holes, PA-C     Allergies:     Allergies  Allergen Reactions  . Morphine And Related Hives and Nausea And Vomiting  . Robaxin [Methocarbamol] Hives and Nausea And Vomiting  . Solu-Medrol [Methylprednisolone] Nausea And Vomiting  . Toradol [Ketorolac Tromethamine] Hives and Nausea Only  . Tramadol Hives and Nausea Only     Physical Exam:   Vitals  Blood pressure 113/74, pulse (!) 103, temperature 98.7 F (37.1 C), temperature source Oral, resp. rate 20, height 6' (1.829 m), weight 106.6 kg (235 lb), SpO2 96 %.  1.  General: Appears in no acute distress  2. Psychiatric:  Intact judgement and  insight, awake alert, oriented x 3.  3. Neurologic: No focal neurological deficits, all cranial nerves intact.Strength 5/5 all 4 extremities, sensation intact all 4 extremities, plantars down going.  4. Eyes :  anicteric sclerae, moist conjunctivae with no lid lag. PERRLA.  5. ENMT:  Oropharynx clear with moist mucous membranes and good dentition  6. Neck:  supple, no cervical lymphadenopathy appriciated, No thyromegaly  7. Respiratory : Normal respiratory effort, good air movement bilaterally,clear to  auscultation bilaterally  8. Cardiovascular : RRR, no gallops, rubs or murmurs, no leg edema  9. Gastrointestinal:  Positive bowel sounds, abdomen soft, mild generalized tenderness to palpation ,no hepatosplenomegaly, no rigidity or guarding       10. Skin:  No cyanosis, normal texture and turgor, no rash, lesions or ulcers  11.Musculoskeletal:  Good muscle tone,  joints appear normal , no effusions,  normal range of motion    Data Review:    CBC Recent Labs  Lab 04/20/17 0518  WBC 8.4  HGB 13.6  HCT 39.3  PLT 199  MCV 88.3  MCH 30.6  MCHC 34.6  RDW 13.6  LYMPHSABS 2.0  MONOABS 1.0  EOSABS 0.1  BASOSABS 0.0    ------------------------------------------------------------------------------------------------------------------  Chemistries  Recent Labs  Lab 04/20/17 0518  NA 133*  K 3.9  CL 97*  CO2 25  GLUCOSE  109*  BUN 35*  CREATININE 3.24*  CALCIUM 8.7*  AST 35  ALT 34  ALKPHOS 75  BILITOT 0.8   ------------------------------------------------------------------------------------------------------------------  ------------------------------------------------------------------------------------------------------------------ GFR: Estimated Creatinine Clearance: 32.1 mL/min (A) (by C-G formula based on SCr of 3.24 mg/dL (H)). Liver Function Tests: Recent Labs  Lab 04/20/17 0518  AST 35  ALT 34  ALKPHOS 75  BILITOT 0.8  PROT 6.8  ALBUMIN 4.1   No results for input(s): LIPASE, AMYLASE in the last 168 hours. No results for input(s): AMMONIA in the last 168 hours. Coagulation Profile: No results for input(s): INR, PROTIME in the last 168 hours. Cardiac Enzymes: No results for input(s): CKTOTAL, CKMB, CKMBINDEX, TROPONINI in the last 168 hours. BNP (last 3 results) No results for input(s): PROBNP in the last 8760 hours. HbA1C: No results for input(s): HGBA1C in the last 72 hours. CBG: No results for input(s): GLUCAP in the last 168 hours. Lipid Profile: No results for input(s): CHOL, HDL, LDLCALC, TRIG, CHOLHDL, LDLDIRECT in the last 72 hours. Thyroid Function Tests: No results for input(s): TSH, T4TOTAL, FREET4, T3FREE, THYROIDAB in the last 72 hours. Anemia Panel: No results for input(s): VITAMINB12, FOLATE, FERRITIN, TIBC, IRON, RETICCTPCT in the last 72 hours.  --------------------------------------------------------------------------------------------------------------- Urine analysis:    Component Value Date/Time   COLORURINE YELLOW 04/20/2017 0515   APPEARANCEUR HAZY (A) 04/20/2017 0515   LABSPEC 1.025 04/20/2017 0515   PHURINE 5.5 04/20/2017 0515   GLUCOSEU  100 (A) 04/20/2017 0515   HGBUR NEGATIVE 04/20/2017 0515   BILIRUBINUR NEGATIVE 04/20/2017 0515   KETONESUR 15 (A) 04/20/2017 0515   PROTEINUR NEGATIVE 04/20/2017 0515   NITRITE NEGATIVE 04/20/2017 0515   LEUKOCYTESUR NEGATIVE 04/20/2017 0515      Imaging Results:    Ct Renal Stone Study  Result Date: 04/20/2017 CLINICAL DATA:  57 year old male with flank pain. Right lower abdominal pain and hematuria. EXAM: CT ABDOMEN AND PELVIS WITHOUT CONTRAST TECHNIQUE: Multidetector CT imaging of the abdomen and pelvis was performed following the standard protocol without IV contrast. COMPARISON:  Abdominal CT dated 04/04/2017 FINDINGS: Evaluation of this exam is limited in the absence of intravenous contrast. Lower chest: Bibasilar dependent atelectatic changes. Partially visualized 7 mm subpleural nodule in the lingula (series 2, image 1). No intra-abdominal free air or free fluid. Hepatobiliary: Diffuse fatty infiltration of the liver. No intrahepatic biliary ductal dilatation. The gallbladder is unremarkable. Pancreas: Unremarkable. No pancreatic ductal dilatation or surrounding inflammatory changes. Spleen: Normal in size without focal abnormality. Adrenals/Urinary Tract: The adrenal glands are unremarkable. Subcentimeter exophytic hypodense lesion in the upper pole of the right kidney is too small to characterize. The kidneys, visualized ureters, and urinary bladder are otherwise unremarkable. Stomach/Bowel: There is moderate stool throughout the colon. No bowel obstruction or active inflammation. Normal appendix. Vascular/Lymphatic: Mild aortoiliac atherosclerotic disease. The abdominal aorta and IVC are otherwise unremarkable on this noncontrast CT. No portal venous gas. There is no adenopathy. Reproductive: The prostate and seminal vesicles are grossly unremarkable. Other: Right inguinal hernia repair. Musculoskeletal: Degenerative changes of the spine. L5-S1 disc desiccation and vacuum phenomena.  Multilevel lower lumbar facet arthropathy. No acute osseous pathology. IMPRESSION: 1. No acute intra-abdominal or pelvic pathology. 2. Fatty liver. 3. Mild Aortic Atherosclerosis (ICD10-I70.0). Electronically Signed   By: Elgie Collard M.D.   On: 04/20/2017 06:28    My personal review of EKG: Rhythm NSR, nonspecific ST T changeschanges   Assessment & Plan:    Active Problems:   AKI (acute kidney injury) (HCC)   1. Acute  kidney injury-unclear etiology, patient did not have significant vomiting or diarrhea over the past few days just had vomiting last night.  Likely dehydration.  We will start him on IV normal saline.  Check BMP in a.m.  We will also check renal ultrasound as he does have history of urinary hesitancy.  Check hemoglobin A1c 2. Abdominal pain-patient had vomiting along with abdominal pain, likely viral gastroenteritis.  Continue oxycodone as needed for pain.  Zofran as needed for nausea vomiting.  CT abdomen and pelvis was negative for acute pathology 3. Abnormal EKG-patient has mildly abnormal EKG with nonspecific ST changes, he did have chest pain 2 days ago.  Will check troponin every 6 hours x3.  He does not have previous history of CAD. 4. Hyperlipidemia- continue Pravachol 5. Chronic back pain-Continue oxycodone 30 mg p.o. every 6 hours as needed    DVT Prophylaxis-   heparin  AM Labs Ordered, also please review Full Orders  Family Communication: Admission, patients condition and plan of care including tests being ordered have been discussed with the patient  who indicate understanding and agree with the plan and Code Status.  Code Status: Full code  Admission status: Observation  Time spent in minutes : 60 minutes   Meredeth Ide M.D on 04/20/2017 at 5:49 PM  Between 7am to 7pm - Pager - [phone]. After 7pm go to [email or web address] - password [facility]  [facility] - Office  [phone]

## 2017-04-21 ENCOUNTER — Other Ambulatory Visit: Payer: Self-pay

## 2017-04-21 ENCOUNTER — Observation Stay (HOSPITAL_COMMUNITY): Payer: Medicare Other

## 2017-04-21 DIAGNOSIS — N179 Acute kidney failure, unspecified: Secondary | ICD-10-CM | POA: Diagnosis not present

## 2017-04-21 DIAGNOSIS — R1031 Right lower quadrant pain: Secondary | ICD-10-CM | POA: Diagnosis not present

## 2017-04-21 LAB — CBC
HCT: 39.9 % (ref 39.0–52.0)
HEMOGLOBIN: 13.4 g/dL (ref 13.0–17.0)
MCH: 30.8 pg (ref 26.0–34.0)
MCHC: 33.6 g/dL (ref 30.0–36.0)
MCV: 91.7 fL (ref 78.0–100.0)
Platelets: 183 10*3/uL (ref 150–400)
RBC: 4.35 MIL/uL (ref 4.22–5.81)
RDW: 13.5 % (ref 11.5–15.5)
WBC: 5.8 10*3/uL (ref 4.0–10.5)

## 2017-04-21 LAB — COMPREHENSIVE METABOLIC PANEL
ALBUMIN: 3.8 g/dL (ref 3.5–5.0)
ALK PHOS: 71 U/L (ref 38–126)
ALT: 33 U/L (ref 17–63)
ANION GAP: 8 (ref 5–15)
AST: 38 U/L (ref 15–41)
BUN: 18 mg/dL (ref 6–20)
CALCIUM: 8.6 mg/dL — AB (ref 8.9–10.3)
CHLORIDE: 104 mmol/L (ref 101–111)
CO2: 28 mmol/L (ref 22–32)
Creatinine, Ser: 1.23 mg/dL (ref 0.61–1.24)
GFR calc Af Amer: >60 mL/min (ref >60)
GFR calc non Af Amer: >60 mL/min (ref >60)
GLUCOSE: 125 mg/dL — AB (ref 65–99)
Potassium: 4.3 mmol/L (ref 3.5–5.1)
SODIUM: 140 mmol/L (ref 135–145)
Total Bilirubin: 0.8 mg/dL (ref 0.3–1.2)
Total Protein: 6.4 g/dL — ABNORMAL LOW (ref 6.5–8.1)

## 2017-04-21 LAB — TROPONIN I

## 2017-04-21 LAB — HIV ANTIBODY (ROUTINE TESTING W REFLEX): HIV SCREEN 4TH GENERATION: NONREACTIVE

## 2017-04-21 MED ORDER — TAMSULOSIN HCL 0.4 MG PO CAPS: 0.4000 mg | ORAL_CAPSULE | Freq: Every day | ORAL | 2 refills | Status: DC

## 2017-04-21 MED ORDER — LOSARTAN POTASSIUM 100 MG PO TABS: 50.0000 mg | ORAL_TABLET | Freq: Every day | ORAL | 0 refills | Status: DC

## 2017-04-21 NOTE — Discharge Summary (Addendum)
Physician Discharge Summary  Mark Kelley AYT:[id number] DOB: [birth year] DOA: 04/20/2017  [facility]  Admit date: 04/20/2017 Discharge date: 04/21/2017  Time spent: 25* minutes  Recommendations for Outpatient Follow-up:  1. Follow up PCP in 2 weeks 2. Follow  Hemoglobin a1c as outpatient   Discharge Diagnoses:  Active Problems:   AKI (acute kidney injury) [facility])   Discharge Condition: Stable  Diet recommendation: Regular diet  Filed Weights   04/20/17 0453  Weight: 106.6 kg (235 lb)    History of present illness:  57 y.o. male,With history of chronic back pain on chronic opioids, hypertension, hyperlipidemia came to hospital with acute onset of right-sided abdominal pain.  Patient says that the pain woke him up from the sleep.  He also had 4 episodes of vomiting yesterday.  Denies diarrhea or constipation.  Patient had chest pain 2 days ago which resolved after he had large burp. Patient does have history of urinary dribbling, he does have previous history of hematuria after he had trauma to right kidney as a teenager. He had intermittent symptoms of urinary hesitancy. He currently denies chest pain, no shortness of breath. Denies any fever or chills. No dysuria In the ED, CT of the abdomen pelvis was done which showed no significant abnormality. Lab work revealed acute kidney injury with creatinine 3.24, patient baseline creatinine around 1.                                        Hospital Course:  1. Acute kidney injury- resolved likely from vomiting. Creatinine is now back to baseline 1.23. Patient was started on IV normal saline.   Renal ultrasound  showed normal kidneys, high post void residual, will start Flomax 0.4 mg po q hs. 2. Abdominal pain-patient had vomiting along with abdominal pain, likely viral gastroenteritis.  Continue oxycodone as needed for pain.  Zofran as needed for nausea vomiting.  CT abdomen and pelvis was negative for acute  pathology 3. Abnormal EKG-patient had mildly abnormal EKG with nonspecific ST changes, he did have chest pain 2 days ago.   troponin every 6 hours x3 was negative.  He does not have previous history of CAD. 4. Hyperlipidemia- continue Pravachol 5. Hypertension- BP is stable, will cut down the dose of Losartan to 50 mg pod daily, as we have started Flomax 0.4 mg po q hs. 6. Chronic back pain-Continue oxycodone 30 mg p.o. every 6 hours as needed     Procedures:  Renal ultrasound  Consultations:  None   Discharge Exam: Vitals:   04/20/17 2046 04/21/17 0553  BP: (!) 119/45 (!) 125/100  Pulse: (!) 101 86  Resp: 18 16  Temp: 99 F (37.2 C) 98.1 F (36.7 C)  SpO2: 99% 91%    General: Appears in no acute distress Cardiovascular: Sis2 rrr Respiratory:    Clear bilaterally  Discharge Instructions    Allergies as of 04/21/2017      Reactions   Morphine And Related Hives, Nausea And Vomiting   Robaxin [methocarbamol] Hives, Nausea And Vomiting   Solu-medrol [methylprednisolone] Nausea And Vomiting   Toradol [ketorolac Tromethamine] Hives, Nausea Only   Tramadol Hives, Nausea Only      Medication List    STOP taking these medications   ibuprofen 800 MG tablet Commonly known as:  ADVIL,MOTRIN     TAKE these medications   acetaminophen 500 MG tablet Commonly known as:  TYLENOL Take 1,500 mg by mouth daily as needed for headache.   amitriptyline 50 MG tablet Commonly known as:  ELAVIL Take 1 tablet (50 mg total) by mouth at bedtime.   aspirin EC 81 MG tablet Take 81 mg by mouth daily.   gabapentin 600 MG tablet Commonly known as:  NEURONTIN Take 600 mg by mouth QID.   losartan 100 MG tablet Commonly known as:  COZAAR Take 0.5 tablets (50 mg total) by mouth daily. What changed:  how much to take   omeprazole 40 MG capsule Commonly known as:  PRILOSEC Take 40 mg by mouth daily.   ondansetron 4 MG tablet Commonly known as:  ZOFRAN Take 1 tablet (4 mg total)  by mouth every 6 (six) hours.   oxycodone 30 MG immediate release tablet Commonly known as:  ROXICODONE Take 30 mg by mouth 4 (four) times daily as needed for severe pain.   phenazopyridine 200 MG tablet Commonly known as:  PYRIDIUM Take 1 tablet (200 mg total) by mouth 3 (three) times daily.   pravastatin 80 MG tablet Commonly known as:  PRAVACHOL Take 80 mg by mouth daily.   ranitidine 300 MG tablet Commonly known as:  ZANTAC Take 1 tablet (300 mg total) by mouth at bedtime.   sucralfate 1 g tablet Commonly known as:  CARAFATE Take 1 g by mouth 2 (two) times daily.   tamsulosin 0.4 MG Caps capsule Commonly known as:  FLOMAX Take 1 capsule (0.4 mg total) by mouth daily after supper.   tiZANidine 4 MG capsule Commonly known as:  ZANAFLEX Take 1 capsule (4 mg total) by mouth QID.   VITAMIN D PO Take 1 tablet by mouth.   zolpidem 10 MG tablet Commonly known as:  AMBIEN Take 10 mg by mouth at bedtime as needed for sleep.      Allergies  Allergen Reactions  . Morphine And Related Hives and Nausea And Vomiting  . Robaxin [Methocarbamol] Hives and Nausea And Vomiting  . Solu-Medrol [Methylprednisolone] Nausea And Vomiting  . Toradol [Ketorolac Tromethamine] Hives and Nausea Only  . Tramadol Hives and Nausea Only      The results of significant diagnostics from this hospitalization (including imaging, microbiology, ancillary and laboratory) are listed below for reference.    Significant Diagnostic Studies: Ct Abdomen Pelvis W Contrast  Result Date: 04/04/2017 CLINICAL DATA:  Acute generalized abdominal pain, dysuria, nausea for 1 day. EXAM: CT ABDOMEN AND PELVIS WITH CONTRAST TECHNIQUE: Multidetector CT imaging of the abdomen and pelvis was performed using the standard protocol following bolus administration of intravenous contrast. CONTRAST:  ISOVUE-300 IOPAMIDOL (ISOVUE-300) INJECTION 61% COMPARISON:  None. FINDINGS: Lower chest: No acute abnormality  Hepatobiliary: Probable mild hepatic steatosis noted. No other hepatic abnormalities are identified. The gallbladder is unremarkable. No biliary dilatation. Pancreas: Unremarkable Spleen: Unremarkable Adrenals/Urinary Tract: The kidneys, adrenal glands and bladder are unremarkable. There is no evidence of hydronephrosis or urinary calculi. Stomach/Bowel: Stomach is within normal limits. Appendix appears normal. No evidence of bowel wall thickening, distention, or inflammatory changes. Vascular/Lymphatic: Aortic atherosclerosis. No enlarged abdominal or pelvic lymph nodes. Reproductive: Prostate is unremarkable. Other: No ascites, abscess or pneumoperitoneum. A inguinal hernia repair noted. A small left inguinal hernia containing fat is present. Musculoskeletal: No acute or significant osseous findings. Lumbar scoliosis and moderate degenerative disc disease at L5-S1 noted. IMPRESSION: 1. No acute abnormality or findings to suggest a cause for this patient's abdominal pain. 2. Probable mild hepatic steatosis 3. Small left inguinal hernia containing  fat 4.  Aortic Atherosclerosis (ICD10-I70.0). Electronically Signed   By: Harmon PierJeffrey  Hu M.D.   On: 04/04/2017 13:01   [place] Renal  Result Date: 04/21/2017 CLINICAL DATA:  Acute kidney injury. EXAM: RENAL / URINARY TRACT ULTRASOUND COMPLETE COMPARISON:  Noncontrast CT scan of the abdomen and pelvis of 20 April 2017 FINDINGS: Right Kidney: Length: 13.2 cm. The renal cortical echotexture is normal. There is no hydronephrosis nor cystic nor solid mass. Left Kidney: Length: 11.2 cm. The renal cortical echotexture is similar to that on the right. There is no hydronephrosis nor cystic nor solid mass. Bladder: Appears normal for degree of bladder distention. There is incomplete emptying on the postvoid images. IMPRESSION: Normal appearance of both kidneys. There is a moderate postvoid residual urine volume in the bladder. Electronically Signed   By: David  Swazil[place]Jordan M.D.   On:  04/21/2017 10:42   Ct Renal Stone Study  Result Date: 04/20/2017 CLINICAL DATA:  57 year old male with flank pain. Right lower abdominal pain and hematuria. EXAM: CT ABDOMEN AND PELVIS WITHOUT CONTRAST TECHNIQUE: Multidetector CT imaging of the abdomen and pelvis was performed following the standard protocol without IV contrast. COMPARISON:  Abdominal CT dated 04/04/2017 FINDINGS: Evaluation of this exam is limited in the absence of intravenous contrast. Lower chest: Bibasilar dependent atelectatic changes. Partially visualized 7 mm subpleural nodule in the lingula (series 2, image 1). No intra-abdominal free air or free fluid. Hepatobiliary: Diffuse fatty infiltration of the liver. No intrahepatic biliary ductal dilatation. The gallbladder is unremarkable. Pancreas: Unremarkable. No pancreatic ductal dilatation or surrounding inflammatory changes. Spleen: Normal in size without focal abnormality. Adrenals/Urinary Tract: The adrenal glands are unremarkable. Subcentimeter exophytic hypodense lesion in the upper pole of the right kidney is too small to characterize. The kidneys, visualized ureters, and urinary bladder are otherwise unremarkable. Stomach/Bowel: There is moderate stool throughout the colon. No bowel obstruction or active inflammation. Normal appendix. Vascular/Lymphatic: Mild aortoiliac atherosclerotic disease. The abdominal aorta and IVC are otherwise unremarkable on this noncontrast CT. No portal venous gas. There is no adenopathy. Reproductive: The prostate and seminal vesicles are grossly unremarkable. Other: Right inguinal hernia repair. Musculoskeletal: Degenerative changes of the spine. L5-S1 disc desiccation and vacuum phenomena. Multilevel lower lumbar facet arthropathy. No acute osseous pathology. IMPRESSION: 1. No acute intra-abdominal or pelvic pathology. 2. Fatty liver. 3. Mild Aortic Atherosclerosis (ICD10-I70.0). Electronically Signed   By: Elgie CollardArash  Radparvar M.D.   On: 04/20/2017 06:28     Microbiology: No results found for this or any previous visit (from the past 240 hour(s)).   Labs: Basic Metabolic Panel: Recent Labs  Lab 04/20/17 0518 04/20/17 1759 04/21/17 0622  NA 133*  --  140  K 3.9  --  4.3  CL 97*  --  104  CO2 25  --  28  GLUCOSE 109*  --  125*  BUN 35*  --  18  CREATININE 3.24* 1.62* 1.23  CALCIUM 8.7*  --  8.6*   Liver Function Tests: Recent Labs  Lab 04/20/17 0518 04/21/17 0622  AST 35 38  ALT 34 33  ALKPHOS 75 71  BILITOT 0.8 0.8  PROT 6.8 6.4*  ALBUMIN 4.1 3.8   No results for input(s): LIPASE, AMYLASE in the last 168 hours. No results for input(s): AMMONIA in the last 168 hours. CBC: Recent Labs  Lab 04/20/17 0518 04/20/17 1759 04/21/17 0622  WBC 8.4 5.5 5.8  NEUTROABS 5.2  --   --   HGB 13.6 13.8 13.4  HCT 39.3 40.6 39.9  MCV 88.3 88.8 91.7  PLT 199 192 183   Cardiac Enzymes: Recent Labs  Lab 04/20/17 1759 04/21/17 0622  TROPONINI <0.03 <0.03   BNP:     Signed:  Meredeth Ide MD.  [facility] 04/21/2017, 11:38 AM

## 2017-04-22 LAB — HEMOGLOBIN A1C
Hgb A1c MFr Bld: 6.7 % — ABNORMAL HIGH (ref 4.8–5.6)
MEAN PLASMA GLUCOSE: 146 mg/dL

## 2017-05-19 ENCOUNTER — Other Ambulatory Visit: Payer: Self-pay | Admitting: Orthopedic Surgery

## 2017-05-19 DIAGNOSIS — Z96611 Presence of right artificial shoulder joint: Secondary | ICD-10-CM

## 2017-05-20 ENCOUNTER — Other Ambulatory Visit: Payer: Self-pay

## 2017-05-20 ENCOUNTER — Other Ambulatory Visit: Payer: Self-pay | Admitting: Orthopedic Surgery

## 2017-05-20 ENCOUNTER — Emergency Department (HOSPITAL_BASED_OUTPATIENT_CLINIC_OR_DEPARTMENT_OTHER)
Admission: EM | Admit: 2017-05-20 | Discharge: 2017-05-20 | Disposition: A | Discharge status: Home / Self Care | Payer: Medicare Other | Attending: Emergency Medicine | Admitting: Emergency Medicine

## 2017-05-20 ENCOUNTER — Encounter (HOSPITAL_BASED_OUTPATIENT_CLINIC_OR_DEPARTMENT_OTHER): Payer: Self-pay | Admitting: Emergency Medicine

## 2017-05-20 DIAGNOSIS — Z96611 Presence of right artificial shoulder joint: Secondary | ICD-10-CM

## 2017-05-20 DIAGNOSIS — R4781 Slurred speech: Secondary | ICD-10-CM | POA: Insufficient documentation

## 2017-05-20 DIAGNOSIS — R2689 Other abnormalities of gait and mobility: Secondary | ICD-10-CM | POA: Diagnosis present

## 2017-05-20 DIAGNOSIS — Z5321 Procedure and treatment not carried out due to patient leaving prior to being seen by health care provider: Secondary | ICD-10-CM | POA: Insufficient documentation

## 2017-05-20 LAB — CBG MONITORING, ED: GLUCOSE-CAPILLARY: 133 mg/dL — AB (ref 65–99)

## 2017-05-20 NOTE — ED Triage Notes (Signed)
Patient reports difficulty walking and slurred speech which began 8 days ago.  Reports bilateral lower extremity swelling.  Reports he is constantly dropping things out of his hands.  Ambulatory to triage without difficulty.  Speaking in full sentences.  Alert and oriented x4.

## 2017-06-11 ENCOUNTER — Encounter: Admission: RE | Admit: 2017-06-11 | Payer: Medicare Other | Source: Ambulatory Visit

## 2017-06-17 ENCOUNTER — Other Ambulatory Visit: Payer: Self-pay | Admitting: Orthopedic Surgery

## 2017-06-17 DIAGNOSIS — Z96611 Presence of right artificial shoulder joint: Secondary | ICD-10-CM

## 2017-06-25 ENCOUNTER — Other Ambulatory Visit: Payer: Self-pay | Admitting: Orthopedic Surgery

## 2017-06-25 ENCOUNTER — Other Ambulatory Visit (HOSPITAL_COMMUNITY): Payer: Self-pay | Admitting: Orthopedic Surgery

## 2017-06-25 DIAGNOSIS — Z96611 Presence of right artificial shoulder joint: Secondary | ICD-10-CM

## 2017-06-29 ENCOUNTER — Ambulatory Visit: Admission: RE | Admit: 2017-06-29 | Payer: Medicare Other | Source: Ambulatory Visit

## 2017-07-02 ENCOUNTER — Encounter (HOSPITAL_COMMUNITY)
Admission: RE | Admit: 2017-07-02 | Discharge: 2017-07-02 | Disposition: A | Discharge status: Home / Self Care | Payer: Medicare Other | Source: Ambulatory Visit | Attending: Orthopedic Surgery | Admitting: Orthopedic Surgery

## 2017-07-02 DIAGNOSIS — Z96611 Presence of right artificial shoulder joint: Secondary | ICD-10-CM | POA: Insufficient documentation

## 2017-07-02 MED ORDER — TECHNETIUM TC 99M MEDRONATE IV KIT
21.3000 | PACK | Freq: Once | INTRAVENOUS | Status: AC | PRN
  Administered 2017-07-02: 21.3 via INTRAVENOUS

## 2017-07-07 ENCOUNTER — Ambulatory Visit (HOSPITAL_COMMUNITY)
Admission: RE | Admit: 2017-07-07 | Discharge: 2017-07-07 | Disposition: A | Discharge status: Home / Self Care | Payer: Medicare Other | Source: Ambulatory Visit | Attending: Orthopedic Surgery | Admitting: Orthopedic Surgery

## 2017-07-07 DIAGNOSIS — M25511 Pain in right shoulder: Secondary | ICD-10-CM | POA: Insufficient documentation

## 2017-07-07 DIAGNOSIS — Z96611 Presence of right artificial shoulder joint: Secondary | ICD-10-CM

## 2017-07-07 MED ORDER — IOPAMIDOL (ISOVUE-M 200) INJECTION 41%
10.0000 mL | Freq: Once | INTRAMUSCULAR | Status: AC
  Administered 2017-07-07: 3 mL via INTRA_ARTICULAR

## 2017-07-07 MED ORDER — LIDOCAINE HCL (PF) 1 % IJ SOLN
INTRAMUSCULAR | Status: AC
  Administered 2017-07-07: 7.5 mL via INTRADERMAL
  Filled 2017-07-07: qty 5

## 2017-07-07 MED ORDER — LIDOCAINE HCL (PF) 1 % IJ SOLN
5.0000 mL | Freq: Once | INTRAMUSCULAR | Status: AC
  Administered 2017-07-07: 7.5 mL via INTRADERMAL

## 2017-07-07 MED ORDER — IOPAMIDOL (ISOVUE-M 200) INJECTION 41%
INTRAMUSCULAR | Status: AC
  Administered 2017-07-07: 3 mL via INTRA_ARTICULAR
  Filled 2017-07-07: qty 10

## 2017-07-07 MED ORDER — SODIUM CHLORIDE 0.9 % IJ SOLN
10.0000 mL | Freq: Once | INTRAMUSCULAR | Status: AC
  Administered 2017-07-07: 10 mL via INTRAVENOUS

## 2017-07-15 ENCOUNTER — Other Ambulatory Visit: Payer: Self-pay

## 2017-07-15 ENCOUNTER — Emergency Department (HOSPITAL_BASED_OUTPATIENT_CLINIC_OR_DEPARTMENT_OTHER)
Admission: EM | Admit: 2017-07-15 | Discharge: 2017-07-15 | Disposition: A | Discharge status: Home / Self Care | Payer: Medicare Other | Attending: Emergency Medicine | Admitting: Emergency Medicine

## 2017-07-15 ENCOUNTER — Encounter (HOSPITAL_BASED_OUTPATIENT_CLINIC_OR_DEPARTMENT_OTHER): Payer: Self-pay

## 2017-07-15 DIAGNOSIS — I10 Essential (primary) hypertension: Secondary | ICD-10-CM | POA: Diagnosis not present

## 2017-07-15 DIAGNOSIS — M25511 Pain in right shoulder: Secondary | ICD-10-CM

## 2017-07-15 DIAGNOSIS — M25562 Pain in left knee: Secondary | ICD-10-CM | POA: Diagnosis not present

## 2017-07-15 DIAGNOSIS — Z7982 Long term (current) use of aspirin: Secondary | ICD-10-CM | POA: Insufficient documentation

## 2017-07-15 DIAGNOSIS — M25561 Pain in right knee: Secondary | ICD-10-CM

## 2017-07-15 DIAGNOSIS — Z79899 Other long term (current) drug therapy: Secondary | ICD-10-CM | POA: Diagnosis not present

## 2017-07-15 DIAGNOSIS — F17210 Nicotine dependence, cigarettes, uncomplicated: Secondary | ICD-10-CM | POA: Diagnosis not present

## 2017-07-15 DIAGNOSIS — Z7984 Long term (current) use of oral hypoglycemic drugs: Secondary | ICD-10-CM | POA: Diagnosis not present

## 2017-07-15 DIAGNOSIS — G8929 Other chronic pain: Secondary | ICD-10-CM | POA: Insufficient documentation

## 2017-07-15 DIAGNOSIS — Z96659 Presence of unspecified artificial knee joint: Secondary | ICD-10-CM | POA: Insufficient documentation

## 2017-07-15 MED ORDER — TIZANIDINE HCL 4 MG PO TABS: 4.0000 mg | ORAL_TABLET | Freq: Four times a day (QID) | ORAL | 0 refills | Status: DC | PRN

## 2017-07-15 MED ORDER — PREDNISONE 20 MG PO TABS: 40.0000 mg | ORAL_TABLET | Freq: Every day | ORAL | 0 refills | Status: DC

## 2017-07-15 NOTE — ED Provider Notes (Signed)
[facility] [place] EMERGENCY DEPARTMENT Provider Note   CSN: [id number] Arrival date & time: 07/15/17  1603     History   Chief Complaint Chief Complaint  Patient presents with  . Joint Pain    HPI Mark Kelley is a 57 y.o. male.  Patient is a 57 year old male with a history of chronic pain, hypertension, hyperlipidemia, acute kidney injury, recent stroke, prior shoulder and knee surgeries presenting today with right shoulder pain and bilateral knee pain.  Patient states that this is been ongoing for some time but in the last week he has had worsening swelling in his left knee but not as bad as some months.  Pain is an 8 out of 10 and is keeping him awake at night.  He does see pain management and is still taking oxycodone but is not helping with the pain.  Patient has followed up with orthopedist Dr. 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  2 weeks ago he had arthrocentesis and is following up for further plans for shoulder surgery.  Also when he was at his visit with Dr. Rennis Chris they were going to refer him to a partner who does knees.  Patient states he has not seen the knee specialist yet.  He denies any fever or redness of his knees.  Seems to be an ongoing intermittent process.  He denies any trauma.  The history is provided by the patient.    Past Medical History:  Diagnosis Date  . Chronic back pain   . GERD (gastroesophageal reflux disease)   . Hyperlipemia   . Hypertension   . Scoliosis     Patient Active Problem List   Diagnosis Date Noted  . AKI (acute kidney injury) (HCC) 04/20/2017    Past Surgical History:  Procedure Laterality Date  . HERNIA REPAIR    . JOINT REPLACEMENT    . KNEE ARTHROPLASTY    . SHOULDER ARTHROSCOPY          Home Medications    Prior to Admission medications   Medication Sig Start Date End Date Taking? Authorizing Provider  metFORMIN (GLUCOPHAGE) 500  MG tablet Take 500 mg by mouth 2 (two) times daily with a meal.   Yes [provider]  acetaminophen (TYLENOL) 500 MG tablet Take 1,500 mg by mouth daily as needed for headache.    [provider]  amitriptyline (ELAVIL) 50 MG tablet Take 1 tablet (50 mg total) by mouth at bedtime. 02/24/17   Molpus, John, MD  aspirin EC 81 MG tablet Take 81 mg by mouth daily.    [provider]  Cholecalciferol (VITAMIN D PO) Take 1 tablet by mouth.    [provider]  gabapentin (NEURONTIN) 600 MG tablet Take 600 mg by mouth QID.    [provider]  losartan (COZAAR) 100 MG tablet Take 0.5 tablets (50 mg total) by mouth daily. 04/21/17   Meredeth Ide, MD  omeprazole (PRILOSEC) 40 MG capsule Take 40 mg by mouth daily.    [provider]  ondansetron (ZOFRAN) 4 MG tablet Take 1 tablet (4 mg total) by mouth every 6 (six) hours. 04/04/17   Law, Waylan Boga, PA-C  oxycodone (ROXICODONE) 30 MG immediate release tablet Take 30 mg by mouth 4 (four) times daily as needed for severe pain. 04/15/17   [provider]  phenazopyridine (PYRIDIUM) 200 MG tablet Take 1 tablet (200 mg total) by mouth  3 (three) times daily. Patient not taking: Reported on 04/20/2017 04/04/17   Emi Holes, PA-C  pravastatin (PRAVACHOL) 80 MG tablet Take 80 mg by mouth daily.    [provider]  predniSONE (DELTASONE) 20 MG tablet Take 2 tablets (40 mg total) by mouth daily. 07/15/17   Gwyneth Sprout, MD  ranitidine (ZANTAC) 300 MG tablet Take 1 tablet (300 mg total) by mouth at bedtime. 02/24/17   Molpus, John, MD  sucralfate (CARAFATE) 1 g tablet Take 1 g by mouth 2 (two) times daily. 04/12/17   [provider]  tamsulosin (FLOMAX) 0.4 MG CAPS capsule Take 1 capsule (0.4 mg total) by mouth daily after supper. 04/21/17   Meredeth Ide, MD  tiZANidine (ZANAFLEX) 4 MG tablet Take 1 tablet (4 mg total) by mouth every 6 (six) hours as needed for muscle spasms. 07/15/17    Gwyneth Sprout, MD  zolpidem (AMBIEN) 10 MG tablet Take 10 mg by mouth at bedtime as needed for sleep. 04/02/17   [provider]    Family History No family history on file.  Social History Social History   Tobacco Use  . Smoking status: Current Every Day Smoker    Packs/day: 0.50  . Smokeless tobacco: Former Engineer, water Use Topics  . Alcohol use: Yes    Comment: occasional   . Drug use: No     Allergies   Morphine and related; Robaxin [methocarbamol]; Solu-medrol [methylprednisolone]; Toradol [ketorolac tromethamine]; and Tramadol   Review of Systems Review of Systems  All other systems reviewed and are negative.    Physical Exam Updated Vital Signs BP 121/78 (BP Location: Left Arm)   Pulse 88   Temp 98 F (36.7 C) (Oral)   Resp 18   Ht 6' (1.829 m)   Wt 106.6 kg (235 lb)   SpO2 98%   BMI 31.87 kg/m   Physical Exam  Constitutional: He is oriented to person, place, and time. He appears well-developed and well-nourished. No distress.  HENT:  Head: Normocephalic and atraumatic.  Eyes: Pupils are equal, round, and reactive to light. EOM are normal.  Cardiovascular: Normal rate.  Pulmonary/Chest: Effort normal.  Musculoskeletal: He exhibits tenderness.       Right shoulder: He exhibits decreased range of motion, tenderness, bony tenderness, pain and spasm. He exhibits no effusion, no deformity, normal pulse and normal strength.       Right knee: He exhibits swelling and effusion. He exhibits normal range of motion and no erythema. Tenderness found. Medial joint line and lateral joint line tenderness noted.       Left knee: He exhibits swelling and effusion. He exhibits normal range of motion and no deformity. Tenderness found. Medial joint line and lateral joint line tenderness noted.       Arms: Evidence of chronic venous stasis in bilateral lower extremities with numerous varicose veins.  Slowly healing wounds at various stages of healing on the  lower extremities bilaterally  Neurological: He is alert and oriented to person, place, and time.  Skin: Skin is warm and dry.  Psychiatric: He has a normal mood and affect. His behavior is normal.  Nursing note and vitals reviewed.    ED Treatments / Results  Labs (all labs ordered are listed, but only abnormal results are displayed) Labs Reviewed - No data to display  EKG None  Radiology No results found.  Procedures Procedures (including critical care time)  Medications Ordered in ED Medications - No data to display  Initial Impression / Assessment and Plan / ED Course  I have reviewed the triage vital signs and the nursing notes.  Pertinent labs & imaging results that were available during my care of the patient were reviewed by me and considered in my medical decision making (see chart for details).     Patient presenting with bilateral knee and right shoulder pain that has been an ongoing process for months.  Patient is requesting steroids because he states it is always helped in the past when his knee swelling has become severe.  Patient is currently following up with orthopedics and has already been initially evaluated for his shoulder.  Low suspicion that patient has gout or septic joint as he has full range of motion and no warmth, erythema or fever.  Patient denies any trauma or any need for new x-rays as he is recently been imaged and had an arthrocentesis of his shoulder.  Patient is currently seeing pain management and gets oxycodone prescribed last time filled 07/08/2017.  He is out of his muscle relaxer and was given a prescription as well as prednisone.  Plan is for patient to follow-up with orthopedics.  Final Clinical Impressions(s) / ED Diagnoses   Final diagnoses:  Pain in joint of right shoulder  Arthralgia of both knees    ED Discharge Orders        Ordered    tiZANidine (ZANAFLEX) 4 MG tablet  Every 6 hours PRN     07/15/17 1703    predniSONE  (DELTASONE) 20 MG tablet  Daily     07/15/17 1703       Gwyneth Sprout, MD 07/15/17 1711

## 2017-07-15 NOTE — Discharge Instructions (Signed)
Wear knee brace and elevated when possible. Follow up with Dr. Rennis Chris and his partners for your shoulder and knee

## 2017-07-15 NOTE — ED Triage Notes (Addendum)
Pt c/o bilateral knee swelling and right shoulder swelling, states he needs steroids and pain medication and "no one seems to be helping," ambulatory to triage with slight limp

## 2017-07-23 LAB — AEROBIC/ANAEROBIC CULTURE (SURGICAL/DEEP WOUND)

## 2017-07-23 LAB — AEROBIC/ANAEROBIC CULTURE W GRAM STAIN (SURGICAL/DEEP WOUND)

## 2017-08-24 NOTE — Pre-Procedure Instructions (Signed)
Mark Kelley  08/24/2017      [facility] - [place] - [address] [place] [id number] Phone: [phone] Fax: [phone]    Your procedure is scheduled on Thurs., September 03, 2017 from 10:16AM-12:43PM  Report to [facility] Admitting [facility]trance "A" at 8:15AM  Call this number if you have problems the morning of surgery:  [phone]   Remember:  Do not eat or drink after midnight on July 10th    Take these medicines the morning of surgery with A SIP OF WATER: Gabapentin (NEURONTIN), Loratadine (CLARITIN), Omeprazole (PRILOSEC), and PredniSONE (DELTASONE). If needed TiZANidine (ZANAFLEX), Ondansetron (ZOFRAN), Acetaminophen (TYLENOL) for mild pain, Oxycodone (ROXICODONE) for severe pain, and Albuterol Inhaler (Bring with you the day of surgery).  7 days before surgery (7/4), stop taking all Other Aspirin Products, Vitamins, Fish oils, and Herbal medications. Also stop all NSAIDS i.e. Advil, Ibuprofen, Motrin, Aleve, Anaprox, Naproxen, BC, Goody Powders, and all Supplements.  How to Manage Your Diabetes Before and After Surgery  Why is it important to control my blood sugar before and after surgery? . Improving blood sugar levels before and after surgery helps healing and can limit problems. . A way of improving blood sugar control is eating a healthy diet by: o  Eating less sugar and carbohydrates o  Increasing activity/exercise o  Talking with your doctor about reaching your blood sugar goals . High blood sugars (greater than 180 mg/dL) can raise your risk of infections and slow your recovery, so you will need to focus on controlling your diabetes during the weeks before surgery. . Make sure that the doctor who takes care of your diabetes knows about your planned surgery including the date and location.  How do I manage my blood sugar before surgery? . Check your blood sugar at least 4  times a day, starting 2 days before surgery, to make sure that the level is not too high or low. o Check your blood sugar the morning of your surgery when you wake up and every 2 hours until you get to the Short Stay unit. . If your blood sugar is less than 70 mg/dL, you will need to treat for low blood sugar: o Do not take insulin. o Treat a low blood sugar (less than 70 mg/dL) with  cup of clear juice (cranberry or apple), 4 glucose tablets, OR glucose gel. Recheck blood sugar in 15 minutes after treatment (to make sure it is greater than 70 mg/dL). If your blood sugar is not greater than 70 mg/dL on recheck, call [phone] o  for further instructions. . Report your blood sugar to the short stay nurse when you get to Short Stay.  . If you are admitted to the hospital after surgery: o Your blood sugar will be checked by the staff and you will probably be given insulin after surgery (instead of oral diabetes medicines) to make sure you have good blood sugar levels. o The goal for blood sugar control after surgery is 80-180 mg/dL.  WHAT DO I DO ABOUT MY DIABETES MEDICATION?  Marland Kitchen. Do not take MetFORMIN (GLUCOPHAGE) the morning of surgery.  . If your CBG is greater than 220 mg/dL, inform the staff upon arrival to Short Stay.  Reviewed and Endorsed by [facility] Patient Education Committee, August 2015    Do not wear jewelry.  Do not wear lotions, powders, colognes, or  deodorant.  Do not shave 48 hours prior to surgery.  Men may shave face.  Do not bring valuables to the hospital.  [facility] is not responsible for any belongings or valuables.  Contacts, dentures or bridgework may not be worn into surgery.  Leave your suitcase in the car.  After surgery it may be brought to your room.  For patients admitted to the hospital, discharge time will be determined by your treatment team.  Patients discharged the day of surgery will not be allowed to drive home.   Special instructions:    [facility]- Preparing For Surgery  Before surgery, you can play an important role. Because skin is not sterile, your skin needs to be as free of germs as possible. You can reduce the number of germs on your skin by washing with CHG (chlorahexidine gluconate) Soap before surgery.  CHG is an antiseptic cleaner which kills germs and bonds with the skin to continue killing germs even after washing.    Oral Hygiene is also important to reduce your risk of infection.  Remember - BRUSH YOUR TEETH THE MORNING OF SURGERY WITH YOUR REGULAR TOOTHPASTE  Please do not use if you have an allergy to CHG or antibacterial soaps. If your skin becomes reddened/irritated stop using the CHG.  Do not shave (including legs and underarms) for at least 48 hours prior to first CHG shower. It is OK to shave your face.  Please follow these instructions carefully.   1. Shower the NIGHT BEFORE SURGERY and the MORNING OF SURGERY with CHG.   2. If you chose to wash your hair, wash your hair first as usual with your normal shampoo.  3. After you shampoo, rinse your hair and body thoroughly to remove the shampoo.  4. Use CHG as you would any other liquid soap. You can apply CHG directly to the skin and wash gently with a scrungie or a clean washcloth.   5. Apply the CHG Soap to your body ONLY FROM THE NECK DOWN.  Do not use on open wounds or open sores. Avoid contact with your eyes, ears, mouth and genitals (private parts). Wash Face and genitals (private parts)  with your normal soap.  6. Wash thoroughly, paying special attention to the area where your surgery will be performed.  7. Thoroughly rinse your body with warm water from the neck down.  8. DO NOT shower/wash with your normal soap after using and rinsing off the CHG Soap.  9. Pat yourself dry with a CLEAN TOWEL.  10. Wear CLEAN PAJAMAS to bed the night before surgery, wear comfortable clothes the morning of surgery  11. Place CLEAN SHEETS on your bed the  night of your first shower and DO NOT SLEEP WITH PETS.  Day of Surgery:  Do not apply any deodorants/lotions.  Please wear clean clothes to the hospital/surgery center.   Remember to brush your teeth WITH YOUR REGULAR TOOTHPASTE.  Please read over the following fact sheets that you were given. Pain Booklet, Coughing and Deep Breathing, MRSA Information and Surgical Site Infection Prevention

## 2017-08-25 ENCOUNTER — Encounter (HOSPITAL_COMMUNITY)
Admission: RE | Admit: 2017-08-25 | Discharge: 2017-08-25 | Disposition: A | Discharge status: Home / Self Care | Payer: Medicare Other | Source: Ambulatory Visit | Attending: Orthopedic Surgery | Admitting: Orthopedic Surgery

## 2017-08-25 ENCOUNTER — Other Ambulatory Visit: Payer: Self-pay

## 2017-08-25 ENCOUNTER — Encounter (HOSPITAL_COMMUNITY): Payer: Self-pay

## 2017-08-25 DIAGNOSIS — Z01812 Encounter for preprocedural laboratory examination: Secondary | ICD-10-CM | POA: Insufficient documentation

## 2017-08-25 HISTORY — DX: Chronic obstructive pulmonary disease, unspecified: J44.9

## 2017-08-25 HISTORY — DX: Minor contusion of unspecified kidney, initial encounter: S37.019A

## 2017-08-25 HISTORY — DX: Type 2 diabetes mellitus without complications: E11.9

## 2017-08-25 HISTORY — DX: Personal history of other diseases of the digestive system: Z87.19

## 2017-08-25 LAB — CBC
HCT: 44.1 % (ref 39.0–52.0)
Hemoglobin: 14.8 g/dL (ref 13.0–17.0)
MCH: 29.7 pg (ref 26.0–34.0)
MCHC: 33.6 g/dL (ref 30.0–36.0)
MCV: 88.6 fL (ref 78.0–100.0)
Platelets: 217 10*3/uL (ref 150–400)
RBC: 4.98 MIL/uL (ref 4.22–5.81)
RDW: 13.2 % (ref 11.5–15.5)
WBC: 6.1 10*3/uL (ref 4.0–10.5)

## 2017-08-25 LAB — BASIC METABOLIC PANEL
Anion gap: 8 (ref 5–15)
BUN: 24 mg/dL — AB (ref 6–20)
CO2: 24 mmol/L (ref 22–32)
CREATININE: 1.98 mg/dL — AB (ref 0.61–1.24)
Calcium: 8.7 mg/dL — ABNORMAL LOW (ref 8.9–10.3)
Chloride: 104 mmol/L (ref 98–111)
GFR, EST AFRICAN AMERICAN: 41 mL/min — AB (ref >60)
GFR, EST NON AFRICAN AMERICAN: 36 mL/min — AB (ref >60)
Glucose, Bld: 123 mg/dL — ABNORMAL HIGH (ref 70–99)
Potassium: 3.9 mmol/L (ref 3.5–5.1)
SODIUM: 136 mmol/L (ref 135–145)

## 2017-08-25 LAB — GLUCOSE, CAPILLARY: Glucose-Capillary: 128 mg/dL — ABNORMAL HIGH (ref 70–99)

## 2017-08-25 LAB — HEMOGLOBIN A1C
Hgb A1c MFr Bld: 6.7 % — ABNORMAL HIGH (ref 4.8–5.6)
Mean Plasma Glucose: 145.59 mg/dL

## 2017-08-25 LAB — SURGICAL PCR SCREEN
MRSA, PCR: NEGATIVE
STAPHYLOCOCCUS AUREUS: NEGATIVE

## 2017-08-25 NOTE — Progress Notes (Signed)
PCP - Dr. Gwyneth SproutBarry [place]  Cardiologist - Denies  Chest x-ray - 05/20/17 (E)  EKG - 05/20/17 (E)  Stress Test - 2005 & 2006- Neg  ECHO - 2005- Neg  Cardiac Cath - 2005- Neg  Sleep Study - Denies CPAP - None  LABS- 08/25/17: CBC, BMP  ASA- LD- 7/4  HA1C- 08/25/17 Fasting Blood Sugar - 120, Today 128 Checks Blood Sugar ___1__ times a day  Anesthesia- No  Pt denies having chest pain, sob, or fever at this time. All instructions explained to the pt, with a verbal understanding of the material. Pt agrees to go over the instructions while at home for a better understanding. The opportunity to ask questions was provided.

## 2017-09-02 MED ORDER — TRANEXAMIC ACID 1000 MG/10ML IV SOLN
1000.0000 mg | INTRAVENOUS | Status: AC
  Administered 2017-09-03: 1000 mg via INTRAVENOUS
  Filled 2017-09-02: qty 1100

## 2017-09-02 NOTE — Anesthesia Preprocedure Evaluation (Addendum)
Anesthesia Evaluation  Patient identified by MRN, date of birth, ID band Patient awake    Reviewed: Allergy & Precautions, H&P , NPO status , Patient's Chart, lab work & pertinent test results, reviewed documented beta blocker date and time   Airway Mallampati: II  TM Distance: >3 FB Neck ROM: full    Dental no notable dental hx. (+) Poor Dentition, Chipped, Missing,    Pulmonary COPD, Current Smoker,    Pulmonary exam normal breath sounds clear to auscultation       Cardiovascular Exercise Tolerance: Good hypertension, Pt. on medications negative cardio ROS   Rhythm:regular Rate:Normal     Neuro/Psych    GI/Hepatic hiatal hernia, GERD  Medicated,  Endo/Other  diabetes, Oral Hypoglycemic Agents  Renal/GU CRFRenal disease     Musculoskeletal   Abdominal   Peds  Hematology negative hematology ROS (+)   Anesthesia Other Findings   Reproductive/Obstetrics                            Anesthesia Physical Anesthesia Plan  ASA: III  Anesthesia Plan: General   Post-op Pain Management: GA combined w/ Regional for post-op pain   Induction: Intravenous  PONV Risk Score and Plan: 2 and Ondansetron and Treatment may vary due to age or medical condition  Airway Management Planned: Oral ETT  Additional Equipment:   Intra-op Plan:   Post-operative Plan: Extubation in OR  Informed Consent: I have reviewed the patients History and Physical, chart, labs and discussed the procedure including the risks, benefits and alternatives for the proposed anesthesia with the patient or authorized representative who has indicated his/her understanding and acceptance.   Dental Advisory Given  Plan Discussed with: CRNA, Surgeon and Anesthesiologist  Anesthesia Plan Comments: (  )        Anesthesia Quick Evaluation

## 2017-09-03 ENCOUNTER — Other Ambulatory Visit: Payer: Self-pay

## 2017-09-03 ENCOUNTER — Encounter (HOSPITAL_COMMUNITY)
Admission: RE | Disposition: A | Discharge status: Home / Self Care | Payer: Self-pay | Source: Home / Self Care | Attending: Orthopedic Surgery

## 2017-09-03 ENCOUNTER — Inpatient Hospital Stay (HOSPITAL_COMMUNITY): Payer: Medicare Other | Admitting: Anesthesiology

## 2017-09-03 ENCOUNTER — Inpatient Hospital Stay (HOSPITAL_COMMUNITY)
Admission: RE | Admit: 2017-09-03 | Discharge: 2017-09-04 | DRG: 483 | Disposition: A | Discharge status: Home / Self Care | Payer: Medicare Other | Attending: Orthopedic Surgery | Admitting: Orthopedic Surgery

## 2017-09-03 ENCOUNTER — Encounter (HOSPITAL_COMMUNITY): Payer: Self-pay | Admitting: Emergency Medicine

## 2017-09-03 DIAGNOSIS — E785 Hyperlipidemia, unspecified: Secondary | ICD-10-CM | POA: Diagnosis present

## 2017-09-03 DIAGNOSIS — Z885 Allergy status to narcotic agent status: Secondary | ICD-10-CM | POA: Diagnosis not present

## 2017-09-03 DIAGNOSIS — M419 Scoliosis, unspecified: Secondary | ICD-10-CM | POA: Diagnosis present

## 2017-09-03 DIAGNOSIS — M659 Synovitis and tenosynovitis, unspecified: Secondary | ICD-10-CM | POA: Diagnosis present

## 2017-09-03 DIAGNOSIS — F17210 Nicotine dependence, cigarettes, uncomplicated: Secondary | ICD-10-CM | POA: Diagnosis present

## 2017-09-03 DIAGNOSIS — M549 Dorsalgia, unspecified: Secondary | ICD-10-CM | POA: Diagnosis present

## 2017-09-03 DIAGNOSIS — E119 Type 2 diabetes mellitus without complications: Secondary | ICD-10-CM | POA: Diagnosis present

## 2017-09-03 DIAGNOSIS — T8484XA Pain due to internal orthopedic prosthetic devices, implants and grafts, initial encounter: Secondary | ICD-10-CM | POA: Diagnosis present

## 2017-09-03 DIAGNOSIS — Z7984 Long term (current) use of oral hypoglycemic drugs: Secondary | ICD-10-CM | POA: Diagnosis not present

## 2017-09-03 DIAGNOSIS — I10 Essential (primary) hypertension: Secondary | ICD-10-CM | POA: Diagnosis present

## 2017-09-03 DIAGNOSIS — G8929 Other chronic pain: Secondary | ICD-10-CM | POA: Diagnosis present

## 2017-09-03 DIAGNOSIS — J449 Chronic obstructive pulmonary disease, unspecified: Secondary | ICD-10-CM | POA: Diagnosis present

## 2017-09-03 DIAGNOSIS — K219 Gastro-esophageal reflux disease without esophagitis: Secondary | ICD-10-CM | POA: Diagnosis present

## 2017-09-03 DIAGNOSIS — Z888 Allergy status to other drugs, medicaments and biological substances status: Secondary | ICD-10-CM | POA: Diagnosis not present

## 2017-09-03 DIAGNOSIS — T84028A Dislocation of other internal joint prosthesis, initial encounter: Principal | ICD-10-CM | POA: Diagnosis present

## 2017-09-03 DIAGNOSIS — T84098A Other mechanical complication of other internal joint prosthesis, initial encounter: Secondary | ICD-10-CM | POA: Diagnosis present

## 2017-09-03 DIAGNOSIS — Z7952 Long term (current) use of systemic steroids: Secondary | ICD-10-CM

## 2017-09-03 DIAGNOSIS — Z96619 Presence of unspecified artificial shoulder joint: Secondary | ICD-10-CM

## 2017-09-03 DIAGNOSIS — Z96653 Presence of artificial knee joint, bilateral: Secondary | ICD-10-CM | POA: Diagnosis present

## 2017-09-03 DIAGNOSIS — Z7982 Long term (current) use of aspirin: Secondary | ICD-10-CM

## 2017-09-03 DIAGNOSIS — Z96611 Presence of right artificial shoulder joint: Secondary | ICD-10-CM

## 2017-09-03 HISTORY — PX: REVISION TOTAL SHOULDER TO REVERSE TOTAL SHOULDER: SHX6313

## 2017-09-03 LAB — GLUCOSE, CAPILLARY
GLUCOSE-CAPILLARY: 122 mg/dL — AB (ref 70–99)
GLUCOSE-CAPILLARY: 174 mg/dL — AB (ref 70–99)
Glucose-Capillary: 111 mg/dL — ABNORMAL HIGH (ref 70–99)
Glucose-Capillary: 139 mg/dL — ABNORMAL HIGH (ref 70–99)

## 2017-09-03 SURGERY — REVISION, REVERSE TOTAL ARTHROPLASTY, SHOULDER
Anesthesia: General | Site: Shoulder | Laterality: Right

## 2017-09-03 MED ORDER — TIZANIDINE HCL 4 MG PO TABS
4.0000 mg | ORAL_TABLET | Freq: Four times a day (QID) | ORAL | Status: DC | PRN
  Administered 2017-09-04: 4 mg via ORAL
  Filled 2017-09-03: qty 1

## 2017-09-03 MED ORDER — BUPIVACAINE LIPOSOME 1.3 % IJ SUSP
INTRAMUSCULAR | Status: DC | PRN
  Administered 2017-09-03: 10 mL via PERINEURAL

## 2017-09-03 MED ORDER — SUGAMMADEX SODIUM 200 MG/2ML IV SOLN
INTRAVENOUS | Status: AC
  Filled 2017-09-03: qty 2

## 2017-09-03 MED ORDER — ROCURONIUM BROMIDE 100 MG/10ML IV SOLN
INTRAVENOUS | Status: AC
  Filled 2017-09-03: qty 1

## 2017-09-03 MED ORDER — METOCLOPRAMIDE HCL 5 MG PO TABS: 5.0000 mg | ORAL_TABLET | Freq: Three times a day (TID) | ORAL | Status: DC | PRN

## 2017-09-03 MED ORDER — HYDROMORPHONE HCL 1 MG/ML IJ SOLN
0.2500 mg | INTRAMUSCULAR | Status: DC | PRN
  Administered 2017-09-03 (×4): 0.5 mg via INTRAVENOUS

## 2017-09-03 MED ORDER — ONDANSETRON HCL 4 MG/2ML IJ SOLN: 4.0000 mg | Freq: Once | INTRAMUSCULAR | Status: DC | PRN

## 2017-09-03 MED ORDER — PROPOFOL 10 MG/ML IV BOLUS
INTRAVENOUS | Status: DC | PRN
  Administered 2017-09-03: 200 mg via INTRAVENOUS

## 2017-09-03 MED ORDER — ONDANSETRON HCL 4 MG/2ML IJ SOLN
4.0000 mg | Freq: Four times a day (QID) | INTRAMUSCULAR | Status: DC | PRN
  Administered 2017-09-03: 4 mg via INTRAVENOUS
  Filled 2017-09-03: qty 2

## 2017-09-03 MED ORDER — HYDROCHLOROTHIAZIDE 12.5 MG PO CAPS: 12.5000 mg | ORAL_CAPSULE | Freq: Every day | ORAL | Status: DC

## 2017-09-03 MED ORDER — ALBUTEROL SULFATE (2.5 MG/3ML) 0.083% IN NEBU: 2.5000 mg | INHALATION_SOLUTION | RESPIRATORY_TRACT | Status: DC | PRN

## 2017-09-03 MED ORDER — LOSARTAN POTASSIUM 50 MG PO TABS: 100.0000 mg | ORAL_TABLET | Freq: Every day | ORAL | Status: DC

## 2017-09-03 MED ORDER — LIDOCAINE 2% (20 MG/ML) 5 ML SYRINGE
INTRAMUSCULAR | Status: DC | PRN
  Administered 2017-09-03: 100 mg via INTRAVENOUS

## 2017-09-03 MED ORDER — DOCUSATE SODIUM 100 MG PO CAPS
100.0000 mg | ORAL_CAPSULE | Freq: Two times a day (BID) | ORAL | Status: DC
  Administered 2017-09-03: 100 mg via ORAL
  Filled 2017-09-03: qty 1

## 2017-09-03 MED ORDER — CEFAZOLIN SODIUM-DEXTROSE 2-4 GM/100ML-% IV SOLN
2.0000 g | INTRAVENOUS | Status: AC
  Administered 2017-09-03: 2 g via INTRAVENOUS
  Filled 2017-09-03: qty 100

## 2017-09-03 MED ORDER — BISACODYL 5 MG PO TBEC: 5.0000 mg | DELAYED_RELEASE_TABLET | Freq: Every day | ORAL | Status: DC | PRN

## 2017-09-03 MED ORDER — DEXAMETHASONE SODIUM PHOSPHATE 10 MG/ML IJ SOLN
INTRAMUSCULAR | Status: DC | PRN
  Administered 2017-09-03: 10 mg via INTRAVENOUS

## 2017-09-03 MED ORDER — LOSARTAN POTASSIUM-HCTZ 100-12.5 MG PO TABS: 1.0000 | ORAL_TABLET | Freq: Every day | ORAL | Status: DC

## 2017-09-03 MED ORDER — METOCLOPRAMIDE HCL 5 MG/ML IJ SOLN: 5.0000 mg | Freq: Three times a day (TID) | INTRAMUSCULAR | Status: DC | PRN

## 2017-09-03 MED ORDER — OXYCODONE HCL 5 MG/5ML PO SOLN
5.0000 mg | Freq: Once | ORAL | Status: DC | PRN
  Filled 2017-09-03: qty 5

## 2017-09-03 MED ORDER — FENTANYL CITRATE (PF) 100 MCG/2ML IJ SOLN
INTRAMUSCULAR | Status: AC
  Administered 2017-09-03: 50 ug via INTRAVENOUS
  Filled 2017-09-03: qty 4

## 2017-09-03 MED ORDER — ONDANSETRON HCL 4 MG PO TABS: 4.0000 mg | ORAL_TABLET | Freq: Four times a day (QID) | ORAL | Status: DC | PRN

## 2017-09-03 MED ORDER — PROPOFOL 10 MG/ML IV BOLUS
INTRAVENOUS | Status: AC
  Filled 2017-09-03: qty 20

## 2017-09-03 MED ORDER — LACTATED RINGERS IV SOLN
INTRAVENOUS | Status: DC
  Administered 2017-09-03: via INTRAVENOUS

## 2017-09-03 MED ORDER — HYDROMORPHONE HCL 1 MG/ML IJ SOLN
0.5000 mg | INTRAMUSCULAR | Status: DC | PRN
  Administered 2017-09-03 – 2017-09-04 (×4): 1 mg via INTRAVENOUS
  Filled 2017-09-03 (×7): qty 1

## 2017-09-03 MED ORDER — 0.9 % SODIUM CHLORIDE (POUR BTL) OPTIME
TOPICAL | Status: DC | PRN
  Administered 2017-09-03: 1000 mL

## 2017-09-03 MED ORDER — POLYETHYLENE GLYCOL 3350 17 G PO PACK: 17.0000 g | PACK | Freq: Every day | ORAL | Status: DC | PRN

## 2017-09-03 MED ORDER — LACTATED RINGERS IV SOLN
INTRAVENOUS | Status: DC
  Administered 2017-09-03: 09:00:00 via INTRAVENOUS

## 2017-09-03 MED ORDER — VANCOMYCIN HCL 1000 MG IV SOLR
INTRAVENOUS | Status: AC
  Filled 2017-09-03: qty 1000

## 2017-09-03 MED ORDER — SUGAMMADEX SODIUM 200 MG/2ML IV SOLN
INTRAVENOUS | Status: DC | PRN
  Administered 2017-09-03: 200 mg via INTRAVENOUS

## 2017-09-03 MED ORDER — OXYCODONE HCL 5 MG PO TABS: 5.0000 mg | ORAL_TABLET | Freq: Once | ORAL | Status: DC | PRN

## 2017-09-03 MED ORDER — ASPIRIN EC 81 MG PO TBEC
81.0000 mg | DELAYED_RELEASE_TABLET | Freq: Every day | ORAL | Status: DC
  Administered 2017-09-04: 81 mg via ORAL
  Filled 2017-09-03: qty 1

## 2017-09-03 MED ORDER — ACETAMINOPHEN 325 MG PO TABS: 325.0000 mg | ORAL_TABLET | Freq: Four times a day (QID) | ORAL | Status: DC | PRN

## 2017-09-03 MED ORDER — ONDANSETRON HCL 4 MG/2ML IJ SOLN
INTRAMUSCULAR | Status: DC | PRN
  Administered 2017-09-03: 4 mg via INTRAVENOUS

## 2017-09-03 MED ORDER — TAMSULOSIN HCL 0.4 MG PO CAPS
0.4000 mg | ORAL_CAPSULE | Freq: Every day | ORAL | Status: DC
  Administered 2017-09-03: 0.4 mg via ORAL
  Filled 2017-09-03: qty 1

## 2017-09-03 MED ORDER — CLONIDINE HCL (ANALGESIA) 100 MCG/ML EP SOLN
EPIDURAL | Status: DC | PRN
  Administered 2017-09-03: 50 ug

## 2017-09-03 MED ORDER — CHLORHEXIDINE GLUCONATE 4 % EX LIQD: 60.0000 mL | Freq: Once | CUTANEOUS | Status: DC

## 2017-09-03 MED ORDER — FENTANYL CITRATE (PF) 100 MCG/2ML IJ SOLN
INTRAMUSCULAR | Status: AC
  Filled 2017-09-03: qty 2

## 2017-09-03 MED ORDER — ACETAMINOPHEN 160 MG/5ML PO SOLN: 325.0000 mg | ORAL | Status: DC | PRN

## 2017-09-03 MED ORDER — ALBUTEROL SULFATE (2.5 MG/3ML) 0.083% IN NEBU
INHALATION_SOLUTION | RESPIRATORY_TRACT | Status: AC
  Administered 2017-09-03: 2.5 mg via RESPIRATORY_TRACT
  Filled 2017-09-03: qty 3

## 2017-09-03 MED ORDER — LIDOCAINE 2% (20 MG/ML) 5 ML SYRINGE
INTRAMUSCULAR | Status: AC
  Filled 2017-09-03: qty 5

## 2017-09-03 MED ORDER — VANCOMYCIN HCL 1000 MG IV SOLR
INTRAVENOUS | Status: DC | PRN
  Administered 2017-09-03: 1000 mg

## 2017-09-03 MED ORDER — ALBUTEROL SULFATE (2.5 MG/3ML) 0.083% IN NEBU
2.5000 mg | INHALATION_SOLUTION | Freq: Four times a day (QID) | RESPIRATORY_TRACT | Status: DC | PRN
  Administered 2017-09-03: 2.5 mg via RESPIRATORY_TRACT

## 2017-09-03 MED ORDER — MAGNESIUM CITRATE PO SOLN: 1.0000 | Freq: Once | ORAL | Status: DC | PRN

## 2017-09-03 MED ORDER — PHENOL 1.4 % MT LIQD: 1.0000 | OROMUCOSAL | Status: DC | PRN

## 2017-09-03 MED ORDER — ALBUTEROL SULFATE HFA 108 (90 BASE) MCG/ACT IN AERS: 1.0000 | INHALATION_SPRAY | RESPIRATORY_TRACT | Status: DC | PRN

## 2017-09-03 MED ORDER — PHENYLEPHRINE HCL 10 MG/ML IJ SOLN
INTRAMUSCULAR | Status: DC | PRN
  Administered 2017-09-03: 40 ug/min via INTRAVENOUS

## 2017-09-03 MED ORDER — DIPHENHYDRAMINE HCL 12.5 MG/5ML PO ELIX
12.5000 mg | ORAL_SOLUTION | ORAL | Status: DC | PRN
  Administered 2017-09-04: 25 mg via ORAL
  Filled 2017-09-03: qty 10

## 2017-09-03 MED ORDER — ACETAMINOPHEN 325 MG PO TABS: 325.0000 mg | ORAL_TABLET | ORAL | Status: DC | PRN

## 2017-09-03 MED ORDER — FENTANYL CITRATE (PF) 100 MCG/2ML IJ SOLN
100.0000 ug | Freq: Once | INTRAMUSCULAR | Status: AC
  Administered 2017-09-03: 100 ug via INTRAVENOUS
  Filled 2017-09-03: qty 2

## 2017-09-03 MED ORDER — INSULIN ASPART 100 UNIT/ML ~~LOC~~ SOLN
0.0000 [IU] | Freq: Three times a day (TID) | SUBCUTANEOUS | Status: DC
  Administered 2017-09-03: 3 [IU] via SUBCUTANEOUS
  Administered 2017-09-04: 2 [IU] via SUBCUTANEOUS

## 2017-09-03 MED ORDER — HYDROMORPHONE HCL 1 MG/ML IJ SOLN
INTRAMUSCULAR | Status: AC
  Administered 2017-09-03: 0.5 mg via INTRAVENOUS
  Filled 2017-09-03: qty 2

## 2017-09-03 MED ORDER — MEPERIDINE HCL 50 MG/ML IJ SOLN: 6.2500 mg | INTRAMUSCULAR | Status: DC | PRN

## 2017-09-03 MED ORDER — OXYCODONE HCL 5 MG PO TABS
30.0000 mg | ORAL_TABLET | Freq: Four times a day (QID) | ORAL | Status: DC | PRN
  Administered 2017-09-03 – 2017-09-04 (×3): 30 mg via ORAL
  Filled 2017-09-03 (×4): qty 6

## 2017-09-03 MED ORDER — GABAPENTIN 300 MG PO CAPS
600.0000 mg | ORAL_CAPSULE | Freq: Four times a day (QID) | ORAL | Status: DC
  Administered 2017-09-03 (×2): 600 mg via ORAL
  Filled 2017-09-03 (×2): qty 2

## 2017-09-03 MED ORDER — DEXMEDETOMIDINE HCL IN NACL 200 MCG/50ML IV SOLN
INTRAVENOUS | Status: AC
  Filled 2017-09-03: qty 50

## 2017-09-03 MED ORDER — MIDAZOLAM HCL 2 MG/2ML IJ SOLN
2.0000 mg | Freq: Once | INTRAMUSCULAR | Status: AC
  Administered 2017-09-03: 2 mg via INTRAVENOUS
  Filled 2017-09-03: qty 2

## 2017-09-03 MED ORDER — DEXMEDETOMIDINE HCL 200 MCG/2ML IV SOLN
20.0000 ug | Freq: Once | INTRAVENOUS | Status: AC
  Administered 2017-09-03: 20 ug via INTRAVENOUS

## 2017-09-03 MED ORDER — PANTOPRAZOLE SODIUM 40 MG PO TBEC: 40.0000 mg | DELAYED_RELEASE_TABLET | Freq: Every day | ORAL | Status: DC

## 2017-09-03 MED ORDER — ALUM & MAG HYDROXIDE-SIMETH 200-200-20 MG/5ML PO SUSP: 30.0000 mL | ORAL | Status: DC | PRN

## 2017-09-03 MED ORDER — ROCURONIUM BROMIDE 10 MG/ML (PF) SYRINGE
PREFILLED_SYRINGE | INTRAVENOUS | Status: DC | PRN
  Administered 2017-09-03: 70 mg via INTRAVENOUS
  Administered 2017-09-03: 10 mg via INTRAVENOUS

## 2017-09-03 MED ORDER — ZOLPIDEM TARTRATE 10 MG PO TABS
10.0000 mg | ORAL_TABLET | Freq: Every day | ORAL | Status: DC
  Administered 2017-09-03: 10 mg via ORAL
  Filled 2017-09-03: qty 1

## 2017-09-03 MED ORDER — MENTHOL 3 MG MT LOZG: 1.0000 | LOZENGE | OROMUCOSAL | Status: DC | PRN

## 2017-09-03 MED ORDER — AMITRIPTYLINE HCL 50 MG PO TABS
50.0000 mg | ORAL_TABLET | Freq: Every day | ORAL | Status: DC
  Administered 2017-09-03: 50 mg via ORAL
  Filled 2017-09-03: qty 1

## 2017-09-03 MED ORDER — BUPIVACAINE HCL (PF) 0.5 % IJ SOLN
INTRAMUSCULAR | Status: DC | PRN
  Administered 2017-09-03: 20 mL via PERINEURAL

## 2017-09-03 MED ORDER — FENTANYL CITRATE (PF) 100 MCG/2ML IJ SOLN
25.0000 ug | INTRAMUSCULAR | Status: DC | PRN
  Administered 2017-09-03 (×2): 50 ug via INTRAVENOUS

## 2017-09-03 SURGICAL SUPPLY — 64 items
BASEPLATE MOD 24 (Plate) ×2 IMPLANT
BASEPLATE MOD 24MM (Plate) ×1 IMPLANT
BLADE SAW SAG 73X25 THK (BLADE) ×2
BLADE SAW SGTL 73X25 THK (BLADE) ×1 IMPLANT
COVER MAYO STAND STRL (DRAPES) ×3 IMPLANT
COVER SURGICAL LIGHT HANDLE (MISCELLANEOUS) ×3 IMPLANT
CUP REVS UNI 39+ 2MM RT OFFSET (Shoulder) ×3 IMPLANT
CUP SUT UNIV REVERS 39 +2 (Shoulder) ×3 IMPLANT
DERMABOND ADVANCED (GAUZE/BANDAGES/DRESSINGS) ×2
DERMABOND ADVANCED .7 DNX12 (GAUZE/BANDAGES/DRESSINGS) ×1 IMPLANT
DRAPE ORTHO 2.5IN SPLIT 77X108 (DRAPES) ×2 IMPLANT
DRAPE ORTHO SPLIT 77X108 STRL (DRAPES) ×4
DRAPE POUCH INSTRU U-SHP 10X18 (DRAPES) ×3 IMPLANT
DRAPE SURG 17X11 SM STRL (DRAPES) ×3 IMPLANT
DRAPE U-SHAPE 47X51 STRL (DRAPES) ×3 IMPLANT
DRSG AQUACEL AG ADV 3.5X10 (GAUZE/BANDAGES/DRESSINGS) ×3 IMPLANT
DURAPREP 26ML APPLICATOR (WOUND CARE) ×3 IMPLANT
ELECT BLADE TIP CTD 4 INCH (ELECTRODE) ×3 IMPLANT
ELECT CAUTERY BLADE 6.4 (BLADE) ×3 IMPLANT
ELECT REM PT RETURN 15FT ADLT (MISCELLANEOUS) ×3 IMPLANT
GLENOSPHERE 39+4 LAT/24 UNI RV (Joint) ×3 IMPLANT
GLOVE BIO SURGEON STRL SZ7.5 (GLOVE) ×3 IMPLANT
GLOVE BIO SURGEON STRL SZ8 (GLOVE) ×3 IMPLANT
GLOVE ECLIPSE 7.5 STRL STRAW (GLOVE) ×3 IMPLANT
GLOVE EUDERMIC 7 POWDERFREE (GLOVE) ×3 IMPLANT
GOWN STRL REUS W/TWL LRG LVL3 (GOWN DISPOSABLE) ×3 IMPLANT
INSERT HUMERAL MED 39/ +3 (Shoulder) ×1 IMPLANT
INSERT MEDIUM HUMERAL 39/ +3 (Shoulder) ×2 IMPLANT
KIT BASIN OR (CUSTOM PROCEDURE TRAY) ×3 IMPLANT
KIT SET UNIVERSAL (KITS) ×3 IMPLANT
MANIFOLD NEPTUNE II (INSTRUMENTS) ×3 IMPLANT
NEEDLE HYPO 22GX1.5 SAFETY (NEEDLE) ×3 IMPLANT
NEEDLE MA TROC 1/2 (NEEDLE) ×3 IMPLANT
NEEDLE MAYO CATGUT SZ4 (NEEDLE) ×3 IMPLANT
PACK SHOULDER (CUSTOM PROCEDURE TRAY) ×3 IMPLANT
PASSER SUT SWANSON 36MM LOOP (INSTRUMENTS) ×3 IMPLANT
PIN SET, UNIVERS REVERS WITH 2.4MM DRILL TIP GUIDE PIN, 2.4 MM OSTEOTOMY GUIDE PIN AND 2.8 MM GUIDE WIRE ×3 IMPLANT
POSITIONER SURGICAL ARM (MISCELLANEOUS) ×3 IMPLANT
SCREW CENTRAL MODULAR 25 (Screw) ×3 IMPLANT
SCREW PERI LOCK 5.5X24 (Screw) ×6 IMPLANT
SCREW PERI LOCK 5.5X36 (Screw) ×3 IMPLANT
SCREW PERIPHERAL 5.5X28 LOCK (Screw) ×3 IMPLANT
SLING ARM FOAM STRAP LRG (SOFTGOODS) ×3 IMPLANT
SLING ARM IMMOBILIZER LRG (SOFTGOODS) ×3 IMPLANT
SOL PREP POV-IOD 4OZ 10% (MISCELLANEOUS) ×3 IMPLANT
SPACER REVERS TI 39/+9MM (Spacer) ×1 IMPLANT
SPACER SHLD UNI REV 39 +9 (Spacer) ×2 IMPLANT
SPONGE LAP 18X18 X RAY DECT (DISPOSABLE) ×3 IMPLANT
STAPLER VISISTAT 35W (STAPLE) ×3 IMPLANT
STEM HUMERAL UNIVERS SZ8 (Stem) ×3 IMPLANT
SUCTION FRAZIER HANDLE 10FR (MISCELLANEOUS) ×4
SUCTION FRAZIER HANDLE 12FR (TUBING) ×2
SUCTION TUBE FRAZIER 10FR DISP (MISCELLANEOUS) ×2 IMPLANT
SUCTION TUBE FRAZIER 12FR DISP (TUBING) ×1 IMPLANT
SUCTION YANKAUER HANDLE (MISCELLANEOUS) ×3 IMPLANT
SUT ETHIBOND 2 OS 4 DA (SUTURE) ×15 IMPLANT
SUT ETHIBOND 5 LR DA (SUTURE) ×3 IMPLANT
SUT MNCRL AB 3-0 PS2 18 (SUTURE) ×3 IMPLANT
SUT MON AB 2-0 CT1 36 (SUTURE) ×3 IMPLANT
SUT VIC AB 0 CT1 36 (SUTURE) ×9 IMPLANT
SUT VIC AB 1 CT1 36 (SUTURE) ×9 IMPLANT
SUT VIC AB 2-0 CT1 27 (SUTURE) ×6
SUT VIC AB 2-0 CT1 TAPERPNT 27 (SUTURE) ×3 IMPLANT
TOWER CARTRIDGE SMART MIX (DISPOSABLE) ×3 IMPLANT

## 2017-09-03 NOTE — Anesthesia Postprocedure Evaluation (Signed)
Anesthesia Post Note  Patient: Estelle JuneDean C Poteete  Procedure(s) Performed: Removal of right total shoulder arthroplasty and conversion to reverse shoulder arthroplasty (Right Shoulder)     Patient location during evaluation: PACU Anesthesia Type: General Level of consciousness: awake and alert Pain management: pain level controlled Vital Signs Assessment: post-procedure vital signs reviewed and stable Respiratory status: spontaneous breathing, nonlabored ventilation, respiratory function stable and patient connected to nasal cannula oxygen Cardiovascular status: blood pressure returned to baseline and stable Postop Assessment: no apparent nausea or vomiting Anesthetic complications: no    Last Vitals:  Vitals:   09/03/17 1643 09/03/17 1747  BP: 120/77 120/78  Pulse: 91 88  Resp: 14 14  Temp: 36.5 C   SpO2: 96% 99%    Last Pain:  Vitals:   09/03/17 1736  TempSrc:   PainSc: Asleep                 Trindon Dorton

## 2017-09-03 NOTE — Anesthesia Procedure Notes (Addendum)
Anesthesia Regional Block: Interscalene brachial plexus block   Pre-Anesthetic Checklist: ,, timeout performed, Correct Patient, Correct Site, Correct Laterality, Correct Procedure, Correct Position, site marked, Risks and benefits discussed,  Surgical consent,  Pre-op evaluation,  At surgeon's request and post-op pain management  Laterality: Right  Prep: chloraprep       Needles:  Injection technique: Single-shot  Needle Type: Echogenic Needle     Needle Length: 5cm  Needle Gauge: 21     Additional Needles:   Procedures:,,, paresthesia technique, ultrasound used (permanent image in chart),,,,  Narrative:  Start time: 09/03/2017 9:29 AM End time: 09/03/2017 9:36 AM Injection made incrementally with aspirations every 5 mL.  Performed by: Personally  Anesthesiologist: Bethena Midgetddono, Elyssa Pendelton, MD

## 2017-09-03 NOTE — Transfer of Care (Signed)
Immediate Anesthesia Transfer of Care Note  Patient: Mark Kelley  Procedure(s) Performed: Procedure(s) (LRB): Removal of right total shoulder arthroplasty and conversion to reverse shoulder arthroplasty (Right)  Patient Location: PACU  Anesthesia Type: General  Level of Consciousness: awake, oriented, sedated and patient cooperative  Airway & Oxygen Therapy: Patient Spontanous Breathing and Patient connected to face mask oxygen  Post-op Assessment: Report given to PACU RN and Post -op Vital signs reviewed and stable  Post vital signs: Reviewed and stable  Complications: No apparent anesthesia complications Last Vitals:  Vitals Value Taken Time  BP 126/76 09/03/2017  1:00 PM  Temp    Pulse 80 09/03/2017  1:02 PM  Resp 15 09/03/2017  1:02 PM  SpO2 100 % 09/03/2017  1:02 PM  Vitals shown include unvalidated device data.  Last Pain:  Vitals:   09/03/17 0841  TempSrc:   PainSc: 9       Patients Stated Pain Goal: 4 (09/03/17 0841)

## 2017-09-03 NOTE — Discharge Instructions (Signed)
° °Kevin M. Supple, M.D., F.A.A.O.S. °Orthopaedic Surgery °Specializing in Arthroscopic and Reconstructive °Surgery of the Shoulder and Knee °[phone] °[address] - [place], [place] [address] - Fax [phone] ° ° °POST-OP TOTAL SHOULDER REPLACEMENT INSTRUCTIONS ° °1. Call the office at [phone] to schedule your first post-op appointment 10-14 days from the date of your surgery. ° °2. The bandage over your incision is waterproof. You may begin showering with this dressing on. You may leave this dressing on until first follow up appointment within 2 weeks. We prefer you leave this dressing in place until follow up however after 5-7 days if you are having itching or skin irritation and would like to remove it you may do so. Go slow and tug at the borders gently to break the bond the dressing has with the skin. At this point if there is no drainage it is okay to go without a bandage or you may cover it with a light guaze and tape. You can also expect significant bruising around your shoulder that will drift down your arm and into your chest wall. This is very normal and should resolve over several days. ° ° 3. Wear your sling/immobilizer at all times except to perform the exercises below or to occasionally let your arm dangle by your side to stretch your elbow. You also need to sleep in your sling immobilizer until instructed otherwise. ° °4. Range of motion to your elbow, wrist, and hand are encouraged 3-5 times daily. Exercise to your hand and fingers helps to reduce swelling you may experience. ° °5. Utilize ice to the shoulder 3-5 times minimum a day and additionally if you are experiencing pain. ° °6. Prescriptions for a pain medication and a muscle relaxant are provided for you. It is recommended that if you are experiencing pain that you pain medication alone is not controlling, add the muscle relaxant along with the pain medication which can give additional pain relief. The first 1-2 days  is generally the most severe of your pain and then should gradually decrease. As your pain lessens it is recommended that you decrease your use of the pain medications to an "as needed basis'" only and to always comply with the recommended dosages of the pain medications. ° °7. Pain medications can produce constipation along with their use. If you experience this, the use of an over the counter stool softener or laxative daily is recommended.  ° °8. For additional questions or concerns, please do not hesitate to call the office. If after hours there is an answering service to forward your concerns to the physician on call. ° °9.Pain control following an exparel block ° °To help control your post-operative pain you received a nerve block  performed with Exparel which is a long acting anesthetic (numbing agent) which can provide pain relief and sensations of numbness (and relief of pain) in the operative shoulder and arm for up to 3 days. Sometimes it provides mixed relief, meaning you may still have numbness in certain areas of the arm but can still be able to move  parts of that arm, hand, and fingers. We recommend that your prescribed pain medications  be used as needed. We do not feel it is necessary to "pre medicate" and "stay ahead" of pain.  Taking narcotic pain medications when you are not having any pain can lead to unnecessary and potentially dangerous side effects.  ° °POST-OP EXERCISES ° °Pendulum Exercises ° °Perform pendulum exercises while standing and bending at   the waist. Support your uninvolved arm on a table or chair and allow your operated arm to hang freely. Make sure to do these exercises passively - not using you shoulder muscles. ° °Repeat 20 times. Do 3 sessions per day. ° ° ° ° °

## 2017-09-03 NOTE — Progress Notes (Signed)
Assisted Dr. Oddono with right, ultrasound guided, supraclavicular block. Side rails up, monitors on throughout procedure. See vital signs in flow sheet. Tolerated Procedure well. 

## 2017-09-03 NOTE — Anesthesia Procedure Notes (Signed)
Procedure Name: Intubation Date/Time: 09/03/2017 10:31 AM Performed by: Suan Halter, CRNA Pre-anesthesia Checklist: Patient identified, Emergency Drugs available, Suction available and Patient being monitored Patient Re-evaluated:Patient Re-evaluated prior to induction Oxygen Delivery Method: Circle system utilized Preoxygenation: Pre-oxygenation with 100% oxygen Induction Type: IV induction Ventilation: Mask ventilation without difficulty Laryngoscope Size: Mac and 3 Grade View: Grade I Tube type: Oral Tube size: 7.5 mm Number of attempts: 1 Airway Equipment and Method: Stylet and Oral airway Placement Confirmation: ETT inserted through vocal cords under direct vision,  positive ETCO2 and breath sounds checked- equal and bilateral Secured at: 23 cm Tube secured with: Tape Dental Injury: Teeth and Oropharynx as per pre-operative assessment

## 2017-09-03 NOTE — H&P (Signed)
Mark Kelley    Chief Complaint: failed right total shoulder arthroplasty HPI: The patient is a 57 y.o. male s/p R TSA with evidence clinically for catastrophic failure of rotator cuff and now with pain, instability, and increasing dysfunction. Extensive pre op workup did not yield any compelling evidence for infection. Brought to OR today for revision to RSA  Past Medical History:  Diagnosis Date  . Bruised kidney   . Chronic back pain   . COPD (chronic obstructive pulmonary disease) (HCC)   . Diabetes mellitus without complication (HCC)    Type II  . GERD (gastroesophageal reflux disease)   . History of hiatal hernia   . Hyperlipemia   . Hypertension   . Scoliosis     Past Surgical History:  Procedure Laterality Date  . CARDIAC CATHETERIZATION     2005  . HERNIA REPAIR    . JOINT REPLACEMENT    . KNEE ARTHROPLASTY    . KNEE ARTHROSCOPY     Bilateral  . SHOULDER ARTHROSCOPY    . TOTAL SHOULDER ARTHROPLASTY     Right- Failed    History reviewed. No pertinent family history.  Social History:  reports that he has been smoking.  He has been smoking about 0.50 packs per day. He has quit using smokeless tobacco. He reports that he drinks alcohol. He reports that he does not use drugs.   Medications Prior to Admission  Medication Sig Dispense Refill  . acetaminophen (TYLENOL) 500 MG tablet Take 1,500 mg by mouth daily as needed for headache.    . albuterol (PROVENTIL HFA;VENTOLIN HFA) 108 (90 Base) MCG/ACT inhaler Inhale 1-2 puffs into the lungs every 4 (four) hours as needed for wheezing or shortness of breath.    Marland Kitchen amitriptyline (ELAVIL) 50 MG tablet Take 1 tablet (50 mg total) by mouth at bedtime. 30 tablet 0  . aspirin EC 81 MG tablet Take 81 mg by mouth daily.    . Cholecalciferol (VITAMIN D PO) Take 1 tablet by mouth.    . gabapentin (NEURONTIN) 600 MG tablet Take 600 mg by mouth 4 (four) times daily.     Marland Kitchen losartan-hydrochlorothiazide (HYZAAR) 100-12.5 MG tablet  Take 1 tablet by mouth daily.    . metFORMIN (GLUCOPHAGE) 500 MG tablet Take 500 mg by mouth daily with breakfast.     . omeprazole (PRILOSEC) 40 MG capsule Take 40 mg by mouth daily.    Marland Kitchen oxycodone (ROXICODONE) 30 MG immediate release tablet Take 30 mg by mouth 4 (four) times daily as needed for severe pain.  0  . pravastatin (PRAVACHOL) 80 MG tablet Take 80 mg by mouth daily.    . ranitidine (ZANTAC) 300 MG tablet Take 1 tablet (300 mg total) by mouth at bedtime. 30 tablet 0  . sucralfate (CARAFATE) 1 g tablet Take 1 g by mouth 2 (two) times daily.  1  . tiZANidine (ZANAFLEX) 4 MG tablet Take 1 tablet (4 mg total) by mouth every 6 (six) hours as needed for muscle spasms. 20 tablet 0  . zolpidem (AMBIEN) 10 MG tablet Take 10 mg by mouth at bedtime.   0  . lidocaine (LIDODERM) 5 % Place 1 patch onto the skin daily as needed (leg cramps). Remove & Discard patch within 12 hours or as directed by MD    . loratadine (CLARITIN) 10 MG tablet Take 10 mg by mouth daily.    . ondansetron (ZOFRAN) 4 MG tablet Take 1 tablet (4 mg total) by mouth every 6 (  six) hours. (Patient taking differently: Take 4 mg by mouth every 6 (six) hours as needed for nausea or vomiting. ) 12 tablet 0  . predniSONE (DELTASONE) 20 MG tablet Take 2 tablets (40 mg total) by mouth daily. 10 tablet 0  . tamsulosin (FLOMAX) 0.4 MG CAPS capsule Take 1 capsule (0.4 mg total) by mouth daily after supper. 30 capsule 2     Physical Exam: Right shoulder with painful and restricted motion as noted at recent office visits  Vitals  Temp:  [97.7 F (36.5 C)] 97.7 F (36.5 C) (07/11 0745) Pulse Rate:  [73-80] 80 (07/11 0933) Resp:  [10-18] 11 (07/11 0933) BP: (122)/(80-89) 122/80 (07/11 0933) SpO2:  [96 %-100 %] 98 % (07/11 0933) Weight:  [105.7 kg (233 lb)] 105.7 kg (233 lb) (07/11 0841)  Assessment/Plan  Impression: failed right total shoulder arthroplasty  Plan of Action: Procedure(s): Removal of right total shoulder  arthroplasty and conversion to reverse shoulder arthroplasty  Haniel Fix M Pamla Pangle 09/03/2017, 9:42 AM Contact # [phone]

## 2017-09-03 NOTE — Op Note (Signed)
09/03/2017  12:29 PM  PATIENT:   Mark Kelley  57 y.o. male  PRE-OPERATIVE DIAGNOSIS:  failed right total shoulder arthroplasty  POST-OPERATIVE DIAGNOSIS:  same  PROCEDURE:  Removal R TSA and conversion to RSA #8 stem, +9 spacer, +3 poly, small baseplate, 39/+4 glenosphere  SURGEON:  Terese Heier, Mark ReaKevin M. M.D.  ASSISTANTS: Shuford pac   ANESTHESIA:   GET + ISB  EBL: 250  SPECIMEN:  none  Drains: none   PATIENT DISPOSITION:  PACU - hemodynamically stable.    PLAN OF CARE: Admit for overnight observation  Brief history:   [name] has been followed for a painful failed right total shoulder arthroplasty.  His index procedure performed in the [place] several years ago and unfortunately developed progressive increasing pain, instability, and functional limitations related to catastrophic failure of the rotator cuff.  His current exam shows a well-healed but brought anterior incision.  He has marked prominence of his implant anterosuperiorly with profoundly restricted mobility and severe pain.  He underwent an extensive preoperative evaluation including aspiration of the joint lab tests and various studies ruling out any obvious septic process.  Due to his continued pain and functional rotation she is brought to the operating this time for planned revision arthroplasty with removal of his implant and conversion to a reverse prosthesis  Preoperative counseled [name] regarding treatment options as well as potential risks versus benefits thereof.  Possible surgical complications were all reviewed including potential for bleeding, infection, neurovascular injury, persistent pain, loss of motion, failure of the implant, and possible need for additional surgery.  He understands and accepts and agrees with her planned procedure.  Procedure in detail  After undergoing routine preop evaluation patient was evaluated in preop holding area by the anesthesia department and had an  interscalene block established using Exparel.  Did receive prophylactic antibiotics.  Brought to the operating placed supine on the table underwent smooth induction of a general endotracheal anesthesia.  Placed in the beachchair position and appropriate padding protected.  The right shoulder girdle region was then sterilely prepped and draped in standard fashion.  Timeout was called.  An anterior incision to the right shoulder was made through his previous incisions approximately 12 cm in length.  Skin flaps were elevated dissection carried deeply electrocautery was used for hemostasis.  The deltopectoral interval was then identified proximally and remnants of the saphenous vein were identified and retracted laterally with the deltoid in the interval was then developed from proximal distal.  The deeper layer showed that there was complete disruption of the superior and anterior aspects of the rotator cuff with gross instability implant.  There was essentially no interval between the conjoined tendon and the remnants of the subscapularis and so this was simply retracted medially as a single layer.  Divided adhesions beneath the deltoid and then divided soft tissue remnants from the humeral neck region allowing us to deliver the humeral head to the wound.  Multiple sutures from the previous subscapularis repair were then identified and removed with rondure the neural head was then removed allowing us to gain access to the proximal humeral metaphysis and the humeral stem.  Use rondure to remove soft tissue from this region once appropriate exposure had been gained used a thin flexible osteotome to free up the implant circumferentially and then placed a slaphammer onto the apex of the implant and were able to remove the implant and were able to preserve the vast majority the proximal humeral bone much to our  satisfaction.  At this point we then performed an additional wall resection of the proximal humerus using an  oscillating saw removing approximately half centimeter of additional humeral neck bone.  A trial stem with metal cap was then placed into the humeral canal to protect the proximal humeral bone cut and at this point we then exposed the glenoid with combination of Fukuda, pitchfork, inspecting retractors.  There was intense diffuse synovitis and we performed an extensive synovectomy and gaining complete visualization of the periphery of the glenoid and the glenoid was grossly loose and showed actually severe wear superiorly with thinning of the apex of the glenoid to no more than 2 mm.  Once I had completely remove the synovial tissues at the margins of the glenoid was removed as a single piece without difficulty.  Beneath the glenoid implant we found that there was significant cystic erosions of the glenoid vault inferiorly.  Soft tissue had grown down beneath the glenoid and we carefully remove this with curettes and rongeurs and gained a clean bony surface for implantation of our new glenoid baseplate.  This region was then copiously irrigated.  We then prepared for the small MGS baseplate placing a central guidepin into the previously placed central peg and did place this at a 10 degree inferior inclination.  We reamed both centrally and then peripherally and then tapped centrally to a 25 mm depth.  At this point we then assembled our MGS baseplate with the 25 lag screw and this was then placed into the glenoid obtaining excellent bony purchase and fixation.  This point we then placed the 4 peripheral locking screws of the appropriate length and these all as well obtained excellent bony purchase and fixation.  Peripheral reaming was then performed about the baseplate to confirm we had proper exposure and clearance for the glenosphere.  The 39+4 glenosphere was then placed over a neutral baseplate impacted and then central locking screw was placed in the overall position alignment was much to our satisfaction.   This point we then returned our attention to the proximal humerus where we hand reamed the canal to size 7 broached up to size 8 with proper seating and excellent fit fixation.  We then used the posterior upset to ream the metaphysis.  We then placed our trial which showed appropriate soft tissue balance and good fit.  At this point the trial was removed we assembled the final implant on the back table which was a size 8 stem with a posterior offset 39 metaphysis this was assembled and then impacted into the humeral canal.  We then performed a series of trial reductions and ultimately felt that the total of +12 would be appropriate so we used a +9 spacer +3 poly-these were all inserted and then final reduction was performed which showed good soft tissue balance good shoulder motion good stability.  The wound was irrigated we did apply vancomycin powder liberally about the wound and had performed this during the case as well as a closure the deltopectoral interval was then reapproximated with a series of figure-of-eight and 1 Vicryl sutures.  2-0 Vicryl used with subcu layer intracuticular through Monocryl for the skin followed by Dermabond and Aquasol dressing quadrants placed in sling patient awakened explained to recovery in stable condition.  Mark Bathe, PA-C was used as an Geophysicist/field seismologist throughout this case essential for help with positioning the patient position extremity tissue manipulation suture management wound closure implantation the prosthesis and intraoperative decision-making.  Mark Kelley  Mark Kelley # [phone]

## 2017-09-04 ENCOUNTER — Encounter (HOSPITAL_COMMUNITY): Payer: Self-pay | Admitting: Orthopedic Surgery

## 2017-09-04 LAB — GLUCOSE, CAPILLARY: Glucose-Capillary: 139 mg/dL — ABNORMAL HIGH (ref 70–99)

## 2017-09-04 MED ORDER — HYDROMORPHONE HCL 2 MG PO TABS: 2.0000 mg | ORAL_TABLET | ORAL | 0 refills | Status: DC | PRN

## 2017-09-04 MED ORDER — TIZANIDINE HCL 4 MG PO TABS: 4.0000 mg | ORAL_TABLET | Freq: Four times a day (QID) | ORAL | 0 refills | Status: AC | PRN

## 2017-09-04 NOTE — Progress Notes (Signed)
Patient complained of pain level 10 overnight. Patient's O2 sats in the 70's. Placed on 3L nasal cannula, sats increased to the high 90's. Vitals stable. Wife at bedside overnight.

## 2017-09-04 NOTE — Discharge Summary (Signed)
PATIENT ID:      Mark Kelley  MRN:     [id number] DOB/AGE:    57-Jul-1962 / 57 y.o.     DISCHARGE SUMMARY  ADMISSION DATE:    09/03/2017 DISCHARGE DATE:    ADMISSION DIAGNOSIS: failed right total shoulder arthroplasty Past Medical History:  Diagnosis Date  . Bruised kidney   . Chronic back pain   . COPD (chronic obstructive pulmonary disease) (HCC)   . Diabetes mellitus without complication (HCC)    Type II  . GERD (gastroesophageal reflux disease)   . History of hiatal hernia   . Hyperlipemia   . Hypertension   . Scoliosis     DISCHARGE DIAGNOSIS:   Active Problems:   S/p reverse total shoulder arthroplasty   PROCEDURE: Procedure(s): Removal of right total shoulder arthroplasty and conversion to reverse shoulder arthroplasty on 09/03/2017  CONSULTS:    HISTORY:  See H&P in chart.  HOSPITAL COURSE:  Mark Kelley is a 57 y.o. admitted on 09/03/2017 with a diagnosis of failed right total shoulder arthroplasty.  They were brought to the operating room on 09/03/2017 and underwent Procedure(s): Removal of right total shoulder arthroplasty and conversion to reverse shoulder arthroplasty.    They were given perioperative antibiotics:  Anti-infectives (From admission, onward)   Start     Dose/Rate Route Frequency Ordered Stop   09/03/17 1114  vancomycin (VANCOCIN) powder  Status:  Discontinued       As needed 09/03/17 1114 09/03/17 1251   09/03/17 0800  ceFAZolin (ANCEF) IVPB 2g/100 mL premix     2 g 200 mL/hr over 30 Minutes Intravenous On call to O.R. 09/03/17 0747 09/03/17 1037    .  Patient underwent the above named procedure and tolerated it well. The following day they were hemodynamically stable and pain was controlled on oral analgesics. They were neurovascularly intact to the operative extremity. OT was ordered but  patient declined. They were medically and orthopaedically stable for discharge on day 1 .     DIAGNOSTIC STUDIES:  RECENT RADIOGRAPHIC STUDIES :   No results found.  RECENT VITAL SIGNS:   Patient Vitals for the past 24 hrs:  BP Temp Temp src Pulse Resp SpO2 Height Weight  09/04/17 0628 (!) 144/95 99 F (37.2 C) Oral 95 16 100 % - -  09/04/17 0111 (!) 147/95 (!) 97.5 F (36.4 C) Oral 100 16 100 % - -  09/04/17 0032 - - - - - 97 % - -  09/04/17 0030 - - - - - (!) 78 % - -  09/03/17 2205 110/66 98.9 F (37.2 C) Oral (!) 106 16 94 % - -  09/03/17 1747 120/78 - - 88 14 99 % - -  09/03/17 1643 120/77 97.7 F (36.5 C) Oral 91 14 96 % - -  09/03/17 1549 (!) 134/92 - - 88 14 99 % - -  09/03/17 1444 116/80 (!) 97.5 F (36.4 C) - 79 16 98 % - -  09/03/17 1430 122/85 - - 82 12 99 % - -  09/03/17 1415 111/68 - - 85 14 100 % - -  09/03/17 1400 138/86 (!) 97.5 F (36.4 C) - 85 15 99 % - -  09/03/17 1345 133/79 - - 83 12 100 % - -  09/03/17 1330 (!) 139/95 - - 85 14 100 % - -  09/03/17 1315 127/84 - - 84 13 97 % - -  09/03/17 1300 126/76 - - 81 15 100 % - -  09/03/17 1258 132/90 97.6 F (36.4 C) - 86 13 98 % - -  09/03/17 0949 - - - 81 11 96 % - -  09/03/17 0948 123/78 - - 82 11 96 % - -  09/03/17 0947 - - - 82 10 96 % - -  09/03/17 0946 - - - 82 11 96 % - -  09/03/17 0945 - - - 82 11 96 % - -  09/03/17 0944 - - - 81 11 96 % - -  09/03/17 0943 126/80 - - 83 10 96 % - -  09/03/17 0942 - - - 82 10 97 % - -  09/03/17 0941 - - - 83 10 97 % - -  09/03/17 0940 - - - 83 11 96 % - -  09/03/17 0939 - - - 81 11 96 % - -  09/03/17 0938 123/86 - - 80 11 97 % - -  09/03/17 0933 122/80 - - 80 11 98 % - -  09/03/17 0932 - - - 79 12 97 % - -  09/03/17 0931 - - - 78 14 98 % - -  09/03/17 0930 - - - 79 11 97 % - -  09/03/17 0929 - - - 77 13 97 % - -  09/03/17 0928 - - - 78 10 97 % - -  09/03/17 0927 - - - 77 15 98 % - -  09/03/17 0926 - - - 76 11 96 % - -  09/03/17 0925 - - - 73 10 99 % - -  09/03/17 0924 - - - 77 13 100 % - -  09/03/17 0923 - - - 75 11 100 % - -  09/03/17 0922 - - - 74 15 100 % - -  09/03/17 0921 - - - 77 11 98 % - -   09/03/17 0920 - - - 79 14 98 % - -  09/03/17 0841 - - - - - - 6' (1.829 m) 105.7 kg (233 lb)  .  RECENT EKG RESULTS:    Orders placed or performed during the hospital encounter of 05/20/17  . ED EKG  . ED EKG  . EKG 12-Lead  . EKG 12-Lead  . EKG    DISCHARGE INSTRUCTIONS:  Discharge Instructions    Discharge patient   Complete by:  As directed    Discharge disposition:  01-Home or Self Care   Discharge patient date:  09/04/2017      DISCHARGE MEDICATIONS:   Allergies as of 09/04/2017      Reactions   Morphine And Related Hives, Nausea And Vomiting   Robaxin [methocarbamol] Hives, Nausea And Vomiting   Solu-medrol [methylprednisolone] Nausea And Vomiting   Toradol [ketorolac Tromethamine] Hives, Nausea Only   Tramadol Hives, Nausea Only      Medication List    TAKE these medications   acetaminophen 500 MG tablet Commonly known as:  TYLENOL Take 1,500 mg by mouth daily as needed for headache.   albuterol 108 (90 Base) MCG/ACT inhaler Commonly known as:  PROVENTIL HFA;VENTOLIN HFA Inhale 1-2 puffs into the lungs every 4 (four) hours as needed for wheezing or shortness of breath.   amitriptyline 50 MG tablet Commonly known as:  ELAVIL Take 1 tablet (50 mg total) by mouth at bedtime.   aspirin EC 81 MG tablet Take 81 mg by mouth daily.   gabapentin 600 MG tablet Commonly known as:  NEURONTIN Take 600 mg by mouth 4 (four) times daily.   HYDROmorphone 2  MG tablet Commonly known as:  DILAUDID Take 1 tablet (2 mg total) by mouth every 4 (four) hours as needed for severe pain (for severe pain not controlled by Oxycodone).   lidocaine 5 % Commonly known as:  LIDODERM Place 1 patch onto the skin daily as needed (leg cramps). Remove & Discard patch within 12 hours or as directed by MD   loratadine 10 MG tablet Commonly known as:  CLARITIN Take 10 mg by mouth daily.   losartan-hydrochlorothiazide 100-12.5 MG tablet Commonly known as:  HYZAAR Take 1 tablet by  mouth daily.   metFORMIN 500 MG tablet Commonly known as:  GLUCOPHAGE Take 500 mg by mouth daily with breakfast.   omeprazole 40 MG capsule Commonly known as:  PRILOSEC Take 40 mg by mouth daily.   ondansetron 4 MG tablet Commonly known as:  ZOFRAN Take 1 tablet (4 mg total) by mouth every 6 (six) hours. What changed:    when to take this  reasons to take this   oxycodone 30 MG immediate release tablet Commonly known as:  ROXICODONE Take 30 mg by mouth 4 (four) times daily as needed for severe pain.   pravastatin 80 MG tablet Commonly known as:  PRAVACHOL Take 80 mg by mouth daily.   predniSONE 20 MG tablet Commonly known as:  DELTASONE Take 2 tablets (40 mg total) by mouth daily.   ranitidine 300 MG tablet Commonly known as:  ZANTAC Take 1 tablet (300 mg total) by mouth at bedtime.   sucralfate 1 g tablet Commonly known as:  CARAFATE Take 1 g by mouth 2 (two) times daily.   tamsulosin 0.4 MG Caps capsule Commonly known as:  FLOMAX Take 1 capsule (0.4 mg total) by mouth daily after supper.   tiZANidine 4 MG tablet Commonly known as:  ZANAFLEX Take 1 tablet (4 mg total) by mouth every 6 (six) hours as needed for muscle spasms.   VITAMIN D PO Take 1 tablet by mouth.   zolpidem 10 MG tablet Commonly known as:  AMBIEN Take 10 mg by mouth at bedtime.       FOLLOW UP VISIT:   Follow-up Information    Francena Hanly, MD.   Specialty:  Orthopedic Surgery Why:  call to be seen in 10-14 days Contact information: [address] [place] [address] [phone]           DISCHARGE [id number]   DISCHARGE CONDITION:  Eustaquio Maize Zyeir Dymek for Dr. Francena Hanly 09/04/2017, 8:09 AM

## 2017-09-06 ENCOUNTER — Encounter (HOSPITAL_COMMUNITY): Payer: Self-pay | Admitting: Emergency Medicine

## 2017-09-06 ENCOUNTER — Emergency Department (HOSPITAL_COMMUNITY)
Admission: EM | Admit: 2017-09-06 | Discharge: 2017-09-06 | Disposition: A | Discharge status: Home / Self Care | Payer: Medicare Other | Attending: Emergency Medicine | Admitting: Emergency Medicine

## 2017-09-06 ENCOUNTER — Other Ambulatory Visit: Payer: Self-pay

## 2017-09-06 DIAGNOSIS — Z7982 Long term (current) use of aspirin: Secondary | ICD-10-CM | POA: Insufficient documentation

## 2017-09-06 DIAGNOSIS — R944 Abnormal results of kidney function studies: Secondary | ICD-10-CM | POA: Insufficient documentation

## 2017-09-06 DIAGNOSIS — E119 Type 2 diabetes mellitus without complications: Secondary | ICD-10-CM | POA: Insufficient documentation

## 2017-09-06 DIAGNOSIS — R7989 Other specified abnormal findings of blood chemistry: Secondary | ICD-10-CM

## 2017-09-06 DIAGNOSIS — Z79899 Other long term (current) drug therapy: Secondary | ICD-10-CM | POA: Insufficient documentation

## 2017-09-06 DIAGNOSIS — F17200 Nicotine dependence, unspecified, uncomplicated: Secondary | ICD-10-CM | POA: Diagnosis not present

## 2017-09-06 DIAGNOSIS — Z7984 Long term (current) use of oral hypoglycemic drugs: Secondary | ICD-10-CM | POA: Diagnosis not present

## 2017-09-06 DIAGNOSIS — E785 Hyperlipidemia, unspecified: Secondary | ICD-10-CM | POA: Insufficient documentation

## 2017-09-06 DIAGNOSIS — I10 Essential (primary) hypertension: Secondary | ICD-10-CM | POA: Diagnosis not present

## 2017-09-06 DIAGNOSIS — J449 Chronic obstructive pulmonary disease, unspecified: Secondary | ICD-10-CM | POA: Diagnosis not present

## 2017-09-06 DIAGNOSIS — R0602 Shortness of breath: Secondary | ICD-10-CM | POA: Diagnosis present

## 2017-09-06 LAB — BASIC METABOLIC PANEL
Anion gap: 8 (ref 5–15)
BUN: 13 mg/dL (ref 6–20)
CHLORIDE: 107 mmol/L (ref 98–111)
CO2: 25 mmol/L (ref 22–32)
CREATININE: 1.5 mg/dL — AB (ref 0.61–1.24)
Calcium: 8.1 mg/dL — ABNORMAL LOW (ref 8.9–10.3)
GFR calc Af Amer: 58 mL/min — ABNORMAL LOW (ref >60)
GFR calc non Af Amer: 50 mL/min — ABNORMAL LOW (ref >60)
Glucose, Bld: 155 mg/dL — ABNORMAL HIGH (ref 70–99)
Potassium: 3.7 mmol/L (ref 3.5–5.1)
Sodium: 140 mmol/L (ref 135–145)

## 2017-09-06 MED ORDER — GENERIC EXTERNAL MEDICATION
1.00 | Status: DC
Start: ? — End: 2017-09-06

## 2017-09-06 MED ORDER — IPRATROPIUM BROMIDE HFA 17 MCG/ACT IN AERS
2.00 | INHALATION_SPRAY | RESPIRATORY_TRACT | Status: DC
Start: ? — End: 2017-09-06

## 2017-09-06 MED ORDER — GLUCOSE 40 % PO GEL
15.00 g | ORAL | Status: DC
Start: ? — End: 2017-09-06

## 2017-09-06 MED ORDER — GABAPENTIN 300 MG PO CAPS
600.00 | ORAL_CAPSULE | ORAL | Status: DC
Start: 2017-09-06 — End: 2017-09-06

## 2017-09-06 MED ORDER — OXYCODONE HCL 5 MG PO TABS
5.00 | ORAL_TABLET | ORAL | Status: DC
Start: ? — End: 2017-09-06

## 2017-09-06 MED ORDER — SODIUM CHLORIDE 0.9 % IV BOLUS
500.0000 mL | Freq: Once | INTRAVENOUS | Status: AC
  Administered 2017-09-06: 500 mL via INTRAVENOUS

## 2017-09-06 MED ORDER — DOXYCYCLINE HYCLATE 100 MG PO CAPS: 100.0000 mg | ORAL_CAPSULE | Freq: Two times a day (BID) | ORAL | 0 refills | Status: DC

## 2017-09-06 MED ORDER — INSULIN LISPRO 100 UNIT/ML ~~LOC~~ SOLN
2.00 | SUBCUTANEOUS | Status: DC
Start: 2017-09-06 — End: 2017-09-06

## 2017-09-06 MED ORDER — ONDANSETRON HCL 4 MG/2ML IJ SOLN
4.00 | INTRAMUSCULAR | Status: DC
Start: ? — End: 2017-09-06

## 2017-09-06 MED ORDER — ASPIRIN EC 81 MG PO TBEC
81.00 | DELAYED_RELEASE_TABLET | ORAL | Status: DC
Start: 2017-09-07 — End: 2017-09-06

## 2017-09-06 MED ORDER — SODIUM CHLORIDE 0.9 % IV SOLN
INTRAVENOUS | Status: DC
Start: ? — End: 2017-09-06

## 2017-09-06 MED ORDER — TIZANIDINE HCL 4 MG PO TABS
2.00 | ORAL_TABLET | ORAL | Status: DC
Start: ? — End: 2017-09-06

## 2017-09-06 MED ORDER — HEPARIN SODIUM (PORCINE) 5000 UNIT/ML IJ SOLN
5000.00 | INTRAMUSCULAR | Status: DC
Start: 2017-09-06 — End: 2017-09-06

## 2017-09-06 MED ORDER — SUCRALFATE 1 G PO TABS
1.00 g | ORAL_TABLET | ORAL | Status: DC
Start: 2017-09-06 — End: 2017-09-06

## 2017-09-06 MED ORDER — DOCUSATE SODIUM 100 MG PO CAPS
100.00 | ORAL_CAPSULE | ORAL | Status: DC
Start: ? — End: 2017-09-06

## 2017-09-06 MED ORDER — GENERIC EXTERNAL MEDICATION
500.00 | Status: DC
Start: 2017-09-06 — End: 2017-09-06

## 2017-09-06 MED ORDER — PANTOPRAZOLE SODIUM 40 MG PO TBEC
40.00 | DELAYED_RELEASE_TABLET | ORAL | Status: DC
Start: 2017-09-07 — End: 2017-09-06

## 2017-09-06 MED ORDER — BENZONATATE 100 MG PO CAPS
100.00 | ORAL_CAPSULE | ORAL | Status: DC
Start: ? — End: 2017-09-06

## 2017-09-06 MED ORDER — GENERIC EXTERNAL MEDICATION
400.00 | Status: DC
Start: 2017-09-06 — End: 2017-09-06

## 2017-09-06 MED ORDER — NALOXONE HCL 0.4 MG/ML IJ SOLN
0.40 | INTRAMUSCULAR | Status: DC
Start: ? — End: 2017-09-06

## 2017-09-06 MED ORDER — DEXTROSE 50 % IV SOLN
12.00 g | INTRAVENOUS | Status: DC
Start: ? — End: 2017-09-06

## 2017-09-06 MED ORDER — ACETAMINOPHEN 325 MG PO TABS
650.00 | ORAL_TABLET | ORAL | Status: DC
Start: ? — End: 2017-09-06

## 2017-09-06 MED ORDER — PNEUMOCOCCAL VAC POLYVALENT 25 MCG/0.5ML IJ INJ
0.50 | INJECTION | INTRAMUSCULAR | Status: DC
Start: ? — End: 2017-09-06

## 2017-09-06 MED ORDER — BISACODYL 5 MG PO TBEC
10.00 | DELAYED_RELEASE_TABLET | ORAL | Status: DC
Start: ? — End: 2017-09-06

## 2017-09-06 MED ORDER — ALBUTEROL SULFATE (2.5 MG/3ML) 0.083% IN NEBU
2.50 | INHALATION_SOLUTION | RESPIRATORY_TRACT | Status: DC
Start: ? — End: 2017-09-06

## 2017-09-06 MED ORDER — FAMOTIDINE 20 MG PO TABS
40.00 | ORAL_TABLET | ORAL | Status: DC
Start: 2017-09-06 — End: 2017-09-06

## 2017-09-06 NOTE — ED Notes (Signed)
Patient given sandwich, cheese stick, coke and coffee.

## 2017-09-06 NOTE — ED Notes (Signed)
Pt declined discharge vitals

## 2017-09-06 NOTE — ED Triage Notes (Signed)
Patient here from home with complaints of right pneumonia and kidney failure. Reports that he was admitted to [place] and was told to leave by doctor and come here. Recent surgery last Thursday.

## 2017-09-06 NOTE — ED Provider Notes (Signed)
[facility]-EMERGENCY DEPT Provider Note   CSN: [id number] Arrival date & time: 09/06/17  1144     History   Chief Complaint Chief Complaint  Patient presents with  . Wound Infection  . Pneumonia    HPI Mark Kelley is a 57 y.o. male with a hx of COPD, GERD, T2DM, hyperlipidemia, and HTN who presents to the ED with concerns of ARF and possible surgical site infection today. Patient had right shoulder removal of right total shoulder arthroplasty and conversion to reverse shoulder arthroplasty on 09/03/2017 and was discharged same day. Yesterday (07/13) Patient presented to [facility] ER due to dyspnea with an episode of lightheadedness/dizziness/ diapohresis. He was found to be hypotensive and dyspneic, he underwent CT angio in the ER prior to labs being resulted- no pulmonary embolism identified, atelectasis in the right middle/lower lobes with small peripheral left lung apex ground-glass opacities consistent with probable small foci of pneumonitis. He was found to have an AKI with creatinine of 2.55 and BUN of 16. Patient was subsequently admitted to the hospital for further management for his AKI (felt to be pre-renal) and hypotension. His renal function was improving with gentle fluids (2.13 this AM) and he was being treated with Teflaro and azithromycin for his pneumonia. This AM he felt that the previous hospital was not taking appropriate care of him, there was concerns regarding pain management and possibility of him taking his home medications and meds given by the facility. Patient signed out AMA this morning. He is presenting to this ER with improved shortness of breath- states now only occurring with exertion, he was placed on O2 by triage, but is now on room air. Does report intermittent cough.  He states that he has not had reoccurrence of the dizziness/lightheadedness/diaphoresis spell. He also mentions concern that his shoulder incision site  has felt warm and appeared red to him, he also reports subjective fevers, no measured temps during hospitalizations. Denies chest pain, orthopnea, or PND.    HPI  Past Medical History:  Diagnosis Date  . Bruised kidney   . Chronic back pain   . COPD (chronic obstructive pulmonary disease) (HCC)   . Diabetes mellitus without complication (HCC)    Type II  . GERD (gastroesophageal reflux disease)   . History of hiatal hernia   . Hyperlipemia   . Hypertension   . Scoliosis     Patient Active Problem List   Diagnosis Date Noted  . 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 AKI (acute kidney injury) (HCC) 04/20/2017    Past Surgical History:  Procedure Laterality Date  . CARDIAC CATHETERIZATION     2005  . HERNIA REPAIR    . JOINT REPLACEMENT    . KNEE ARTHROPLASTY    . KNEE ARTHROSCOPY     Bilateral  . REVISION TOTAL SHOULDER TO REVERSE TOTAL SHOULDER Right 09/03/2017   Procedure: Removal of right total shoulder arthroplasty and conversion to reverse shoulder arthroplasty;  Surgeon: Francena Hanly, MD;  Location: [facility];  Service: Orthopedics;  Laterality: Right;  . SHOULDER ARTHROSCOPY    . TOTAL SHOULDER ARTHROPLASTY     Right- Failed        Home Medications    Prior to Admission medications   Medication Sig Start Date End Date Taking? Authorizing Provider  acetaminophen (TYLENOL) 500 MG tablet Take 1,500 mg by mouth daily as needed for headache.    [provider]  albuterol (PROVENTIL HFA;VENTOLIN HFA) 108 (90  Base) MCG/ACT inhaler Inhale 1-2 puffs into the lungs every 4 (four) hours as needed for wheezing or shortness of breath.    [provider]  amitriptyline (ELAVIL) 50 MG tablet Take 1 tablet (50 mg total) by mouth at bedtime. 02/24/17   Molpus, John, MD  aspirin EC 81 MG tablet Take 81 mg by mouth daily.    [provider]  Cholecalciferol (VITAMIN D PO) Take 1 tablet by mouth.    [provider]  gabapentin  (NEURONTIN) 600 MG tablet Take 600 mg by mouth 4 (four) times daily.     [provider]  HYDROmorphone (DILAUDID) 2 MG tablet Take 1 tablet (2 mg total) by mouth every 4 (four) hours as needed for severe pain (for severe pain not controlled by Oxycodone). 09/04/17   Shuford, French Anaracy, PA-C  lidocaine (LIDODERM) 5 % Place 1 patch onto the skin daily as needed (leg cramps). Remove & Discard patch within 12 hours or as directed by MD    [provider]  loratadine (CLARITIN) 10 MG tablet Take 10 mg by mouth daily.    [provider]  losartan-hydrochlorothiazide (HYZAAR) 100-12.5 MG tablet Take 1 tablet by mouth daily.    [provider]  metFORMIN (GLUCOPHAGE) 500 MG tablet Take 500 mg by mouth daily with breakfast.     [provider]  omeprazole (PRILOSEC) 40 MG capsule Take 40 mg by mouth daily.    [provider]  ondansetron (ZOFRAN) 4 MG tablet Take 1 tablet (4 mg total) by mouth every 6 (six) hours. Patient taking differently: Take 4 mg by mouth every 6 (six) hours as needed for nausea or vomiting.  04/04/17   Law, Waylan BogaAlexandra M, PA-C  oxycodone (ROXICODONE) 30 MG immediate release tablet Take 30 mg by mouth 4 (four) times daily as needed for severe pain. 04/15/17   [provider]  pravastatin (PRAVACHOL) 80 MG tablet Take 80 mg by mouth daily.    [provider]  predniSONE (DELTASONE) 20 MG tablet Take 2 tablets (40 mg total) by mouth daily. 07/15/17   Gwyneth SproutPlunkett, Whitney, MD  ranitidine (ZANTAC) 300 MG tablet Take 1 tablet (300 mg total) by mouth at bedtime. 02/24/17   Molpus, John, MD  sucralfate (CARAFATE) 1 g tablet Take 1 g by mouth 2 (two) times daily. 04/12/17   [provider]  tamsulosin (FLOMAX) 0.4 MG CAPS capsule Take 1 capsule (0.4 mg total) by mouth daily after supper. 04/21/17   Meredeth IdeLama, Gagan S, MD  tiZANidine (ZANAFLEX) 4 MG tablet Take 1 tablet (4 mg total) by mouth every 6 (six) hours as needed for muscle spasms.  09/04/17   Shuford, French Anaracy, PA-C  zolpidem (AMBIEN) 10 MG tablet Take 10 mg by mouth at bedtime.  04/02/17   [provider]    Family History No family history on file.  Social History Social History   Tobacco Use  . Smoking status: Current Every Day Smoker    Packs/day: 0.50  . Smokeless tobacco: Former Engineer, waterUser  Substance Use Topics  . Alcohol use: Yes    Comment: occasional   . Drug use: No     Allergies   Morphine and related; Robaxin [methocarbamol]; Solu-medrol [methylprednisolone]; Toradol [ketorolac tromethamine]; and Tramadol   Review of Systems Review of Systems  Constitutional: Positive for fever.  Respiratory: Positive for shortness of breath.   Cardiovascular: Negative for chest pain, palpitations and leg swelling.  Gastrointestinal: Negative for abdominal pain, blood in stool, diarrhea, nausea and  vomiting.  Musculoskeletal: Positive for arthralgias (R shoulder).  Skin: Positive for color change (Redness at surgical site).  Neurological: Negative for dizziness, syncope, weakness, numbness and headaches.  All other systems reviewed and are negative.    Physical Exam Updated Vital Signs BP 118/61 (BP Location: Left Arm)   Pulse (!) 117   Temp 98.9 F (37.2 C) (Oral)   Resp 18   Ht 6' (1.829 m)   Wt 106.6 kg (235 lb)   SpO2 94%   BMI 31.87 kg/m   Physical Exam  Constitutional: He appears well-developed and well-nourished. No distress.  HENT:  Head: Normocephalic and atraumatic.  Eyes: Conjunctivae are normal. Right eye exhibits no discharge. Left eye exhibits no discharge.  Cardiovascular: Regular rhythm. Tachycardia present.  No murmur heard. Pulses:      Radial pulses are 2+ on the right side, and 2+ on the left side.  Pulmonary/Chest: Effort normal and breath sounds normal. No respiratory distress. He has no wheezes. He has no rhonchi. He has no rales.  Abdominal: Soft. He exhibits no distension. There is no tenderness.  Musculoskeletal:    Patient's right upper extremity is in a shoulder sling.  There is a bandage in place, underlying surgical scar appears to be healing appropriately.  There is mild erythema that seems generalized to the upper body, not specific to surgical site.  There is no purulent drainage from the surgical site.  Patient is neurovascularly intact distally.  Neurological: He is alert.  Clear speech.  Sensation grossly intact bilateral upper extremities.  Patient has 5 out of 5 symmetric grip strength.  Skin: Skin is warm and dry. No rash noted.  Psychiatric: He has a normal mood and affect. His behavior is normal.  Nursing note and vitals reviewed.    ED Treatments / Results  Labs Results for orders placed or performed during the hospital encounter of 09/06/17  Basic metabolic panel  Result Value Ref Range   Sodium 140 135 - 145 mmol/L   Potassium 3.7 3.5 - 5.1 mmol/L   Chloride 107 98 - 111 mmol/L   CO2 25 22 - 32 mmol/L   Glucose, Bld 155 (H) 70 - 99 mg/dL   BUN 13 6 - 20 mg/dL   Creatinine, Ser 4.09 (H) 0.61 - 1.24 mg/dL   Calcium 8.1 (L) 8.9 - 10.3 mg/dL   GFR calc non Af Amer 50 (L) >60 mL/min   GFR calc Af Amer 58 (L) >60 mL/min   Anion gap 8 5 - 15   No results found. EKG None  Radiology No results found.  Procedures Procedures (including critical care time)  Medications Ordered in ED Medications - No data to display   Initial Impression / Assessment and Plan / ED Course  I have reviewed the triage vital signs and the nursing notes.  Pertinent labs & imaging results that were available during my care of the patient were reviewed by me and considered in my medical decision making (see chart for details).   Patient presents to the ED after recent AMA departure from [facility] ER after admission for AKI suspected to be pre renal in nature. Patient overall feeling improved. Patient is nontoxic appearing, in no apparent distress, resting comfortably on exam. His initial  vitals are notable for tachycardia with SpO2 of 88%. On my exam tachycardia remains, but is improved, SpO2 remaining > 92% on room air and when ambulatory. Lungs are clear. His exam is fairly benign. He does not appear  to have an acute infection of the L shoulder on exam, he is afebrile, no purulent drainage. Will recheck renal function and give fluids in the ER.   Patient's creatinine is improved to 1.50 from 2.13 this AM, this was 1.98 twelve days ago prior to his surgical procedure. He does not appear to be in respiratory distress, ambulatory throughout hallway without difficulty. Appears hemodynamically stable and safe for discharge home. Will give doxycycline for +/- pneumonia and incentive spirometer. Close PCP and orthopedics follow up. I discussed results, treatment plan, need for follow-up, and return precautions with the patient. Provided opportunity for questions, patient confirmed understanding and is in agreement with plan.   Findings and plan of care discussed with supervising physician Dr. Denton Lank who personally evaluated and examined this patient and is in agreement.   Final Clinical Impressions(s) / ED Diagnoses   Final diagnoses:  Elevated serum creatinine    ED Discharge Orders        Ordered    doxycycline (VIBRAMYCIN) 100 MG capsule  2 times daily     09/06/17 1633       [place] Lasseter, Cruger R, PA-C 09/06/17 2350    Cathren Laine, MD 09/07/17 1223

## 2017-09-06 NOTE — Discharge Instructions (Addendum)
You were seen in the emergency department today due to concern for infection and for recent elevation in your kidney function.  It looks like your kidney function has improved today, creatinine is 1.5.  Please continue to stay well-hydrated to help with this.  We are sending you with doxycycline for pneumonia.  Please take this twice per day. Please take all of your antibiotics until finished. You may develop abdominal discomfort or diarrhea from the antibiotic.  You may help offset this with probiotics which you can buy at the store (ask your pharmacist if unable to find) or get probiotics in the form of eating yogurt. Do not eat or take the probiotics until 2 hours after your antibiotic. If you are unable to tolerate these side effects follow-up with your primary care provider or return to the emergency department.   If you begin to experience any blistering, rashes, swelling, or difficulty breathing seek medical care for evaluation of potentially more serious side effects.   Please be aware that this medication may interact with other medications you are taking, please be sure to discuss your medication list with your pharmacist.   We are also send you home with an incentive spirometer, please use this 4-6 times per day to help with your lung function.  Please follow-up with your orthopedic surgeon Dr. Rennis ChrisSupple in the next 1 to 3 days.  Also follow-up with your primary care provider in the next 1 to 3 days.  Return to the ER anytime for new or worsening symptoms or any other concerns.

## 2017-09-09 ENCOUNTER — Other Ambulatory Visit: Payer: Self-pay

## 2017-09-09 ENCOUNTER — Emergency Department (HOSPITAL_BASED_OUTPATIENT_CLINIC_OR_DEPARTMENT_OTHER)
Admission: EM | Admit: 2017-09-09 | Discharge: 2017-09-09 | Disposition: A | Discharge status: Home / Self Care | Payer: Medicare Other | Attending: Emergency Medicine | Admitting: Emergency Medicine

## 2017-09-09 ENCOUNTER — Encounter (HOSPITAL_BASED_OUTPATIENT_CLINIC_OR_DEPARTMENT_OTHER): Payer: Self-pay

## 2017-09-09 DIAGNOSIS — L7682 Other postprocedural complications of skin and subcutaneous tissue: Secondary | ICD-10-CM | POA: Insufficient documentation

## 2017-09-09 DIAGNOSIS — F17200 Nicotine dependence, unspecified, uncomplicated: Secondary | ICD-10-CM | POA: Insufficient documentation

## 2017-09-09 DIAGNOSIS — Z96659 Presence of unspecified artificial knee joint: Secondary | ICD-10-CM | POA: Insufficient documentation

## 2017-09-09 DIAGNOSIS — Z7982 Long term (current) use of aspirin: Secondary | ICD-10-CM | POA: Diagnosis not present

## 2017-09-09 DIAGNOSIS — E119 Type 2 diabetes mellitus without complications: Secondary | ICD-10-CM | POA: Diagnosis not present

## 2017-09-09 DIAGNOSIS — I10 Essential (primary) hypertension: Secondary | ICD-10-CM | POA: Diagnosis not present

## 2017-09-09 DIAGNOSIS — L7632 Postprocedural hematoma of skin and subcutaneous tissue following other procedure: Secondary | ICD-10-CM

## 2017-09-09 DIAGNOSIS — Z96611 Presence of right artificial shoulder joint: Secondary | ICD-10-CM | POA: Insufficient documentation

## 2017-09-09 DIAGNOSIS — Z7984 Long term (current) use of oral hypoglycemic drugs: Secondary | ICD-10-CM | POA: Insufficient documentation

## 2017-09-09 DIAGNOSIS — Z79899 Other long term (current) drug therapy: Secondary | ICD-10-CM | POA: Insufficient documentation

## 2017-09-09 DIAGNOSIS — J449 Chronic obstructive pulmonary disease, unspecified: Secondary | ICD-10-CM | POA: Insufficient documentation

## 2017-09-09 MED ORDER — AMITRIPTYLINE HCL 50 MG PO TABS: 50.0000 mg | ORAL_TABLET | Freq: Every day | ORAL | 0 refills | Status: AC

## 2017-09-09 MED FILL — AMITRIPTYLINE HCL 50 MG TAB: 50 | 30 days supply | Qty: 30 | Fill #0

## 2017-09-09 NOTE — ED Triage Notes (Signed)
C/o right shoulder surgery 7/11-c/o cont'd pain-states he contacted surgeon office with no call back-pt also c/o "swelling all over"-NAD-steady gait

## 2017-09-09 NOTE — ED Notes (Signed)
Pt verbalized understanding of dc instructions.

## 2017-09-09 NOTE — ED Provider Notes (Addendum)
[facility] EMERGENCY DEPARTMENT Provider Note   CSN: [id number] Arrival date & time: 09/09/17  1121     History   Chief Complaint Chief Complaint  Patient presents with  . Shoulder Pain    HPI Mark Kelley is a 57 y.o. male.  HPI 57 year old male who is 6 days postop from a reverse shoulder arthroplasty on his right by Dr. Francena Hanly.  He presents complaining of persistent swelling of the incision sites.  He was seen 3 days ago in the emergency department and started on doxycycline for possible wound infection.  He states that the antibiotics do not seem to be improving this and the swelling continues.  Reports some mild swelling of his right upper extremity as well.  No weakness in his right hand.  He came to the ER for evaluation.  No chest pain or shortness of breath.  He is also requesting a new sling as well as a refill of his amitriptyline.   Past Medical History:  Diagnosis Date  . Bruised kidney   . Chronic back pain   . COPD (chronic obstructive pulmonary disease) (HCC)   . Diabetes mellitus without complication (HCC)    Type II  . GERD (gastroesophageal reflux disease)   . History of hiatal hernia   . Hyperlipemia   . Hypertension   . Scoliosis     Patient Active Problem List   Diagnosis Date Noted  . 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 AKI (acute kidney injury) (HCC) 04/20/2017    Past Surgical History:  Procedure Laterality Date  . CARDIAC CATHETERIZATION     2005  . HERNIA REPAIR    . JOINT REPLACEMENT    . KNEE ARTHROPLASTY    . KNEE ARTHROSCOPY     Bilateral  . REVISION TOTAL SHOULDER TO REVERSE TOTAL SHOULDER Right 09/03/2017   Procedure: Removal of right total shoulder arthroplasty and conversion to reverse shoulder arthroplasty;  Surgeon: Francena Hanly, MD;  Location: [facility];  Service: Orthopedics;  Laterality: Right;  . SHOULDER ARTHROSCOPY    . TOTAL SHOULDER ARTHROPLASTY     Right- Failed        Home  Medications    Prior to Admission medications   Medication Sig Start Date End Date Taking? Authorizing Provider  acetaminophen (TYLENOL) 500 MG tablet Take 1,500 mg by mouth daily as needed for headache.    [provider]  albuterol (PROVENTIL HFA;VENTOLIN HFA) 108 (90 Base) MCG/ACT inhaler Inhale 1-2 puffs into the lungs every 4 (four) hours as needed for wheezing or shortness of breath.    [provider]  amitriptyline (ELAVIL) 50 MG tablet Take 1 tablet (50 mg total) by mouth at bedtime. 09/09/17   Azalia Bilis, MD  aspirin EC 81 MG tablet Take 81 mg by mouth daily.    [provider]  Cholecalciferol (VITAMIN D PO) Take 1 tablet by mouth.    [provider]  doxycycline (VIBRAMYCIN) 100 MG capsule Take 1 capsule (100 mg total) by mouth 2 (two) times daily. 09/06/17   Petrucelli, Samantha R, PA-C  fenofibrate (TRICOR) 145 MG tablet Take 145 mg by mouth daily.    [provider]  gabapentin (NEURONTIN) 600 MG tablet Take 600 mg by mouth 4 (four) times daily.     [provider]  ibuprofen (ADVIL,MOTRIN) 800 MG tablet Take 800 mg by mouth 3 (three) times daily as needed for pain.    [provider]  lidocaine (LIDODERM) 5 %  Place 1 patch onto the skin daily as needed (leg cramps). Remove & Discard patch within 12 hours or as directed by MD    [provider]  losartan-hydrochlorothiazide (HYZAAR) 100-12.5 MG tablet Take 1 tablet by mouth daily.    [provider]  metFORMIN (GLUCOPHAGE) 500 MG tablet Take 500 mg by mouth 2 (two) times daily with a meal.     [provider]  omeprazole (PRILOSEC) 40 MG capsule Take 40 mg by mouth daily.    [provider]  ondansetron (ZOFRAN) 4 MG tablet Take 1 tablet (4 mg total) by mouth every 6 (six) hours. Patient taking differently: Take 4 mg by mouth every 6 (six) hours as needed for nausea or vomiting.  04/04/17   Law, Waylan Boga, PA-C  oxycodone (ROXICODONE)  30 MG immediate release tablet Take 30 mg by mouth 4 (four) times daily as needed for severe pain. 04/15/17   [provider]  pravastatin (PRAVACHOL) 80 MG tablet Take 80 mg by mouth daily.    [provider]  ranitidine (ZANTAC) 300 MG tablet Take 1 tablet (300 mg total) by mouth at bedtime. 02/24/17   Molpus, John, MD  sucralfate (CARAFATE) 1 g tablet Take 1 g by mouth daily.  04/12/17   [provider]  tamsulosin (FLOMAX) 0.4 MG CAPS capsule Take 1 capsule (0.4 mg total) by mouth daily after supper. 04/21/17   Meredeth Ide, MD  tiZANidine (ZANAFLEX) 4 MG tablet Take 1 tablet (4 mg total) by mouth every 6 (six) hours as needed for muscle spasms. 09/04/17   Shuford, French Ana, PA-C  triamcinolone cream (KENALOG) 0.1 % Apply 1 application topically daily.    [provider]  zolpidem (AMBIEN) 10 MG tablet Take 10 mg by mouth at bedtime.  04/02/17   [provider]    Family History No family history on file.  Social History Social History   Tobacco Use  . Smoking status: Current Every Day Smoker    Packs/day: 0.50  . Smokeless tobacco: Former Engineer, water Use Topics  . Alcohol use: Yes    Comment: occasional   . Drug use: No     Allergies   Morphine and related; Robaxin [methocarbamol]; Solu-medrol [methylprednisolone]; Toradol [ketorolac tromethamine]; and Tramadol   Review of Systems Review of Systems  All other systems reviewed and are negative.    Physical Exam Updated Vital Signs BP 139/79 (BP Location: Left Arm)   Pulse 93   Temp 98.1 F (36.7 C) (Oral)   Resp 18   Ht 6' (1.829 m)   Wt 107 kg (236 lb)   SpO2 98%   BMI 32.01 kg/m   Physical Exam  Constitutional: He is oriented to person, place, and time. He appears well-developed and well-nourished.  HENT:  Head: Normocephalic.  Eyes: EOM are normal.  Neck: Normal range of motion.  Pulmonary/Chest: Effort normal.  Abdominal: He exhibits no distension.    Musculoskeletal: Normal range of motion.  Mild swelling of the surgical incision site without significant warmth or surrounding erythema.  Distal aspect of the surgical incision slightly probed with a Q-tip near an area of near dehiscence and old hematoma was expressed.  Neurological: He is alert and oriented to person, place, and time.  Psychiatric: He has a normal mood and affect.  Nursing note and vitals reviewed.    ED Treatments / Results  Labs (all labs ordered are listed, but only abnormal results are displayed) Labs Reviewed - No data  to display  EKG None  Radiology No results found.  Procedures Procedures (including critical care time)  Medications Ordered in ED Medications - No data to display   Initial Impression / Assessment and Plan / ED Course  I have reviewed the triage vital signs and the nursing notes.  Pertinent labs & imaging results that were available during my care of the patient were reviewed by me and considered in my medical decision making (see chart for details).     No frank pus on examination.  This appears to be postoperative hematoma.  The surgical incision was milked at the bedside and this moderate amount of blood was expressed from the wound with significant improvement in the swelling and fullness of the surgical incision.  He will complete his course of doxycycline which was initiated by the emergency department 3 days ago.  I have asked that he call his orthopedic team for follow-up.  Sling provided.  Amitriptyline prescription provided.  Overall well-appearing.  Doubt DVT.  Doubt significant postoperative infection.  Patient understands return to the ER as needed for new or worsening symptoms.  Patient understands the importance of close follow-up with his surgical team  Final Clinical Impressions(s) / ED Diagnoses   Final diagnoses:  Postoperative hematoma of subcutaneous tissue following non-dermatologic procedure    ED Discharge  Orders        Ordered    amitriptyline (ELAVIL) 50 MG tablet  Daily at bedtime     09/09/17 1320       Azalia Bilisampos, Jackalynn Art, MD 09/09/17 1324    Azalia Bilisampos, Ioane Bhola, MD 09/11/17 1058

## 2017-09-10 ENCOUNTER — Emergency Department (HOSPITAL_COMMUNITY)
Admission: EM | Admit: 2017-09-10 | Discharge: 2017-09-10 | Disposition: A | Discharge status: Home / Self Care | Payer: Medicare Other | Attending: Emergency Medicine | Admitting: Emergency Medicine

## 2017-09-10 ENCOUNTER — Other Ambulatory Visit: Payer: Self-pay

## 2017-09-10 ENCOUNTER — Encounter (HOSPITAL_COMMUNITY): Payer: Self-pay | Admitting: Emergency Medicine

## 2017-09-10 DIAGNOSIS — R2231 Localized swelling, mass and lump, right upper limb: Secondary | ICD-10-CM | POA: Insufficient documentation

## 2017-09-10 DIAGNOSIS — Z5321 Procedure and treatment not carried out due to patient leaving prior to being seen by health care provider: Secondary | ICD-10-CM | POA: Insufficient documentation

## 2017-09-10 LAB — I-STAT CG4 LACTIC ACID, ED: Lactic Acid, Venous: 0.99 mmol/L (ref 0.5–1.9)

## 2017-09-10 LAB — COMPREHENSIVE METABOLIC PANEL
ALBUMIN: 3 g/dL — AB (ref 3.5–5.0)
ALT: 38 U/L (ref 0–44)
ANION GAP: 8 (ref 5–15)
AST: 30 U/L (ref 15–41)
Alkaline Phosphatase: 108 U/L (ref 38–126)
BUN: 9 mg/dL (ref 6–20)
CALCIUM: 8.9 mg/dL (ref 8.9–10.3)
CO2: 28 mmol/L (ref 22–32)
Chloride: 106 mmol/L (ref 98–111)
Creatinine, Ser: 1.08 mg/dL (ref 0.61–1.24)
GFR calc non Af Amer: >60 mL/min (ref >60)
GLUCOSE: 105 mg/dL — AB (ref 70–99)
POTASSIUM: 3.4 mmol/L — AB (ref 3.5–5.1)
SODIUM: 142 mmol/L (ref 135–145)
TOTAL PROTEIN: 5.9 g/dL — AB (ref 6.5–8.1)
Total Bilirubin: 0.8 mg/dL (ref 0.3–1.2)

## 2017-09-10 LAB — URINALYSIS, ROUTINE W REFLEX MICROSCOPIC
BILIRUBIN URINE: NEGATIVE
Glucose, UA: NEGATIVE mg/dL
HGB URINE DIPSTICK: NEGATIVE
KETONES UR: NEGATIVE mg/dL
Leukocytes, UA: NEGATIVE
Nitrite: NEGATIVE
PROTEIN: NEGATIVE mg/dL
Specific Gravity, Urine: 1.02 (ref 1.005–1.030)
pH: 5 (ref 5.0–8.0)

## 2017-09-10 LAB — CBC WITH DIFFERENTIAL/PLATELET
Abs Immature Granulocytes: 0 10*3/uL (ref 0.0–0.1)
Basophils Absolute: 0 10*3/uL (ref 0.0–0.1)
Basophils Relative: 1 %
Eosinophils Absolute: 0.2 10*3/uL (ref 0.0–0.7)
Eosinophils Relative: 4 %
HCT: 33 % — ABNORMAL LOW (ref 39.0–52.0)
Hemoglobin: 10.8 g/dL — ABNORMAL LOW (ref 13.0–17.0)
Immature Granulocytes: 1 %
Lymphocytes Relative: 23 %
Lymphs Abs: 1.2 10*3/uL (ref 0.7–4.0)
MCH: 29.6 pg (ref 26.0–34.0)
MCHC: 32.7 g/dL (ref 30.0–36.0)
MCV: 90.4 fL (ref 78.0–100.0)
Monocytes Absolute: 0.4 10*3/uL (ref 0.1–1.0)
Monocytes Relative: 8 %
Neutro Abs: 3.3 10*3/uL (ref 1.7–7.7)
Neutrophils Relative %: 63 %
Platelets: 268 10*3/uL (ref 150–400)
RBC: 3.65 MIL/uL — ABNORMAL LOW (ref 4.22–5.81)
RDW: 13.2 % (ref 11.5–15.5)
WBC: 5.2 10*3/uL (ref 4.0–10.5)

## 2017-09-10 NOTE — ED Notes (Signed)
Ortho called to have pt in room for evaluation, called pt name with no answer.

## 2017-09-10 NOTE — ED Triage Notes (Signed)
Patient complains of right arm swelling after shoulder surgery on July 11.  Surgical site well approximated, no warmth or purulent drainage. Patient alert, oriented, and in no apparent distress at this time.

## 2018-09-16 ENCOUNTER — Other Ambulatory Visit: Payer: Self-pay

## 2018-09-16 ENCOUNTER — Emergency Department (HOSPITAL_BASED_OUTPATIENT_CLINIC_OR_DEPARTMENT_OTHER): Payer: Medicare HMO

## 2018-09-16 ENCOUNTER — Emergency Department (HOSPITAL_BASED_OUTPATIENT_CLINIC_OR_DEPARTMENT_OTHER)
Admission: EM | Admit: 2018-09-16 | Discharge: 2018-09-16 | Disposition: A | Discharge status: Home / Self Care | Payer: Medicare HMO | Attending: Emergency Medicine | Admitting: Emergency Medicine

## 2018-09-16 ENCOUNTER — Encounter (HOSPITAL_BASED_OUTPATIENT_CLINIC_OR_DEPARTMENT_OTHER): Payer: Self-pay | Admitting: Emergency Medicine

## 2018-09-16 DIAGNOSIS — E119 Type 2 diabetes mellitus without complications: Secondary | ICD-10-CM | POA: Insufficient documentation

## 2018-09-16 DIAGNOSIS — F17200 Nicotine dependence, unspecified, uncomplicated: Secondary | ICD-10-CM | POA: Insufficient documentation

## 2018-09-16 DIAGNOSIS — I10 Essential (primary) hypertension: Secondary | ICD-10-CM | POA: Diagnosis not present

## 2018-09-16 DIAGNOSIS — L03811 Cellulitis of head [any part, except face]: Secondary | ICD-10-CM | POA: Insufficient documentation

## 2018-09-16 DIAGNOSIS — J449 Chronic obstructive pulmonary disease, unspecified: Secondary | ICD-10-CM | POA: Insufficient documentation

## 2018-09-16 LAB — BASIC METABOLIC PANEL
Anion gap: 11 (ref 5–15)
BUN: 20 mg/dL (ref 6–20)
CO2: 22 mmol/L (ref 22–32)
Calcium: 9.2 mg/dL (ref 8.9–10.3)
Chloride: 104 mmol/L (ref 98–111)
Creatinine, Ser: 1.55 mg/dL — ABNORMAL HIGH (ref 0.61–1.24)
GFR calc Af Amer: 56 mL/min — ABNORMAL LOW (ref >60)
GFR calc non Af Amer: 49 mL/min — ABNORMAL LOW (ref >60)
Glucose, Bld: 140 mg/dL — ABNORMAL HIGH (ref 70–99)
Potassium: 3.7 mmol/L (ref 3.5–5.1)
Sodium: 137 mmol/L (ref 135–145)

## 2018-09-16 LAB — CBC
HCT: 47.5 % (ref 39.0–52.0)
Hemoglobin: 16 g/dL (ref 13.0–17.0)
MCH: 30.2 pg (ref 26.0–34.0)
MCHC: 33.7 g/dL (ref 30.0–36.0)
MCV: 89.8 fL (ref 80.0–100.0)
Platelets: 237 10*3/uL (ref 150–400)
RBC: 5.29 MIL/uL (ref 4.22–5.81)
RDW: 13 % (ref 11.5–15.5)
WBC: 12 10*3/uL — ABNORMAL HIGH (ref 4.0–10.5)
nRBC: 0 % (ref 0.0–0.2)

## 2018-09-16 MED ORDER — ACETAMINOPHEN 500 MG PO TABS
ORAL_TABLET | ORAL | Status: AC
  Filled 2018-09-16: qty 2

## 2018-09-16 MED ORDER — IOHEXOL 300 MG/ML  SOLN
100.0000 mL | Freq: Once | INTRAMUSCULAR | Status: AC | PRN
  Administered 2018-09-16: 75 mL via INTRAVENOUS

## 2018-09-16 MED ORDER — HYDROCODONE-ACETAMINOPHEN 5-325 MG PO TABS
1.0000 | ORAL_TABLET | Freq: Once | ORAL | Status: AC
  Administered 2018-09-16: 1 via ORAL
  Filled 2018-09-16: qty 1

## 2018-09-16 MED ORDER — CEPHALEXIN 500 MG PO CAPS: 500.0000 mg | ORAL_CAPSULE | Freq: Three times a day (TID) | ORAL | 0 refills | Status: AC

## 2018-09-16 MED ORDER — ACETAMINOPHEN 500 MG PO TABS: 1000.0000 mg | ORAL_TABLET | Freq: Once | ORAL | Status: DC

## 2018-09-16 MED FILL — CEPHALEXIN 500 MG CAPSULE: 500 | 7 days supply | Qty: 21 | Fill #0

## 2018-09-16 NOTE — Discharge Instructions (Addendum)
You were evaluated in the Emergency Department and after careful evaluation, we did not find any emergent condition requiring admission or further testing in the hospital.  Your symptoms today seem to be due to inflammation and infection of the skin surrounding your recent abscess.  Your CT was otherwise reassuring.  Please take the antibiotics as directed.  We recommend Tylenol at home for pain.  Please return to the Emergency Department if you experience any worsening of your condition.  We encourage you to follow up with a primary care provider.  Thank you for allowing [place] to be a part of your care.

## 2018-09-16 NOTE — ED Triage Notes (Signed)
Pt had "bump" on the back of his neck and yesterday it was the size of a quarter.  He squeezed it and got "stuff" out of it.  Today he woke up and it is much larger and painful.

## 2018-09-16 NOTE — ED Provider Notes (Signed)
[facility] Emergency Department Provider Note MRN:  [id number]  Arrival date & time: 09/16/18     Chief Complaint   Abscess   History of Present Illness   Mark Kelley is a 58 y.o. year-old male with a history of COPD, hypertension presenting to the ED with chief complaint of abscess.  Patient noticed a boil to the back of his head yesterday, tried to express it, leaked some clear and purulent fluid.  Woke up this morning with significant swelling to the entire back of his head and right side of his neck.  Denies fever, no other symptoms.  Review of Systems  A complete 10 system review of systems was obtained and all systems are negative except as noted in the HPI and PMH.   Patient's Health History    Past Medical History:  Diagnosis Date  . Bruised kidney   . Chronic back pain   . COPD (chronic obstructive pulmonary disease) (HCC)   . Diabetes mellitus without complication (HCC)    Type II  . GERD (gastroesophageal reflux disease)   . History of hiatal hernia   . Hyperlipemia   . Hypertension   . Scoliosis     Past Surgical History:  Procedure Laterality Date  . CARDIAC CATHETERIZATION     2005  . HERNIA REPAIR    . JOINT REPLACEMENT    . KNEE ARTHROPLASTY    . KNEE ARTHROSCOPY     Bilateral  . REVISION TOTAL SHOULDER TO REVERSE TOTAL SHOULDER Right 09/03/2017   Procedure: Removal of right total shoulder arthroplasty and conversion to reverse shoulder arthroplasty;  Surgeon: Francena HanlySupple, Kevin, MD;  Location: [facility];  Service: Orthopedics;  Laterality: Right;  . SHOULDER ARTHROSCOPY    . TOTAL SHOULDER ARTHROPLASTY     Right- Failed    No family history on file.  Social History   Socioeconomic History  . Marital status: Married    Spouse name: Not on file  . Number of children: Not on file  . Years of education: Not on file  . Highest education level: Not on file  Occupational History  . Not on file  Social Needs  . Financial  resource strain: Not on file  . Food insecurity    Worry: Not on file    Inability: Not on file  . Transportation needs    Medical: Not on file    Non-medical: Not on file  Tobacco Use  . Smoking status: Current Every Day Smoker    Packs/day: 0.50  . Smokeless tobacco: Former Engineer, waterUser  Substance and Sexual Activity  . Alcohol use: Yes    Comment: occasional   . Drug use: No  . Sexual activity: Not on file  Lifestyle  . Physical activity    Days per week: Not on file    Minutes per session: Not on file  . Stress: Not on file  Relationships  . Social Musicianconnections    Talks on phone: Not on file    Gets together: Not on file    Attends religious service: Not on file    Active member of club or organization: Not on file    Attends meetings of clubs or organizations: Not on file    Relationship status: Not on file  . Intimate partner violence    Fear of current or ex partner: Not on file    Emotionally abused: Not on file    Physically abused: Not on file    Forced  sexual activity: Not on file  Other Topics Concern  . Not on file  Social History Narrative  . Not on file     Physical Exam  Vital Signs and Nursing Notes reviewed Vitals:   09/16/18 1000 09/16/18 1246  BP: (!) 136/93 122/90  Pulse: (!) 105 89  Resp: 16 16  Temp: 98.2 F (36.8 C)   SpO2: 100% 98%    CONSTITUTIONAL: Well-appearing, NAD NEURO:  Alert and oriented x 3, no focal deficits EYES:  eyes equal and reactive ENT/NECK:  no LAD, no JVD CARDIO: Regular rate, well-perfused, normal S1 and S2 PULM:  CTAB no wheezing or rhonchi GI/GU:  normal bowel sounds, non-distended, non-tender MSK/SPINE:  No gross deformities, no edema SKIN: Excoriated lesion to the right occiput with diffuse surrounding edema, no significant erythema or increased warmth PSYCH:  Appropriate speech and behavior  Diagnostic and Interventional Summary    Labs Reviewed  CBC - Abnormal; Notable for the following components:       Result Value   WBC 12.0 (*)    All other components within normal limits  BASIC METABOLIC PANEL - Abnormal; Notable for the following components:   Glucose, Bld 140 (*)    Creatinine, Ser 1.55 (*)    GFR calc non Af Amer 49 (*)    GFR calc Af Amer 56 (*)    All other components within normal limits    CT Soft Tissue Neck W Contrast  Final Result      Medications  acetaminophen (TYLENOL) tablet 1,000 mg (1,000 mg Oral Not Given 09/16/18 1253)  acetaminophen (TYLENOL) 500 MG tablet (has no administration in time range)  HYDROcodone-acetaminophen (NORCO/VICODIN) 5-325 MG per tablet 1 tablet (1 tablet Oral Given 09/16/18 1036)  iohexol (OMNIPAQUE) 300 MG/ML solution 100 mL (75 mLs Intravenous Contrast Given 09/16/18 1133)     Procedures Critical Care  ED Course and Medical Decision Making  I have reviewed the triage vital signs and the nursing notes.  Pertinent labs & imaging results that were available during my care of the patient were reviewed by me and considered in my medical decision making (see below for details).  Large degree of edema surrounding this seemingly well-appearing wound, significantly tender to palpation, on exam does not seem completely consistent with surrounding cellulitis.  CT to exclude abscess or neoplasm.  CT is unremarkable, will cover for cellulitis.  Patient is with normal vital signs, no evidence of systemic illness or sepsis.  After the discussed management above, the patient was determined to be safe for discharge.  The patient was in agreement with this plan and all questions regarding their care were answered.  ED return precautions were discussed and the patient will return to the ED with any significant worsening of condition.  Barth Kirks. Sedonia Small, MD [place] [email or web address]  Final Clinical Impressions(s) / ED Diagnoses     ICD-10-CM   1. Cellulitis of head except face  L03.811     ED  Discharge Orders         Ordered    cephALEXin (KEFLEX) 500 MG capsule  3 times daily     09/16/18 1333             Maudie Flakes, MD 09/16/18 1336

## 2021-06-27 IMAGING — CT CT NECK WITH CONTRAST
3 of 4 series · 13 of 33 positions shown, 16 images · IV contrast (omnipaque)
Comparison: None.

CLINICAL DATA: Painful bump on the back of the right head,
developing quickly.

EXAM:
CT NECK WITH CONTRAST
TECHNIQUE: Multidetector CT imaging of the neck was performed using the
standard protocol following the bolus administration of intravenous
contrast.
CONTRAST:  75mL OMNIPAQUE IOHEXOL 300 MG/ML  SOLN

[Series 4: sag neck · sagittal · 0.48mm/px · 5 of 95 slices shown, 6 images]
[im 32/95  bone]
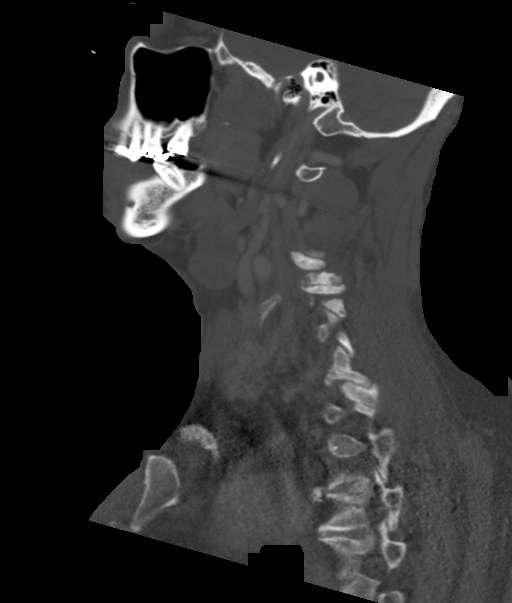
[im 40/95  bone]
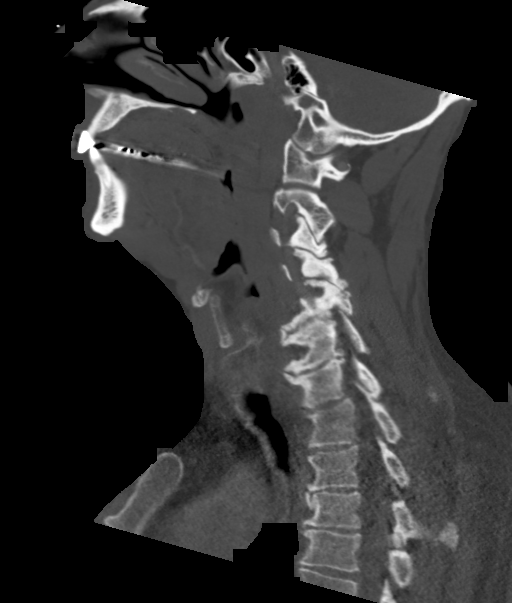
[im 48/95  soft-tissue]
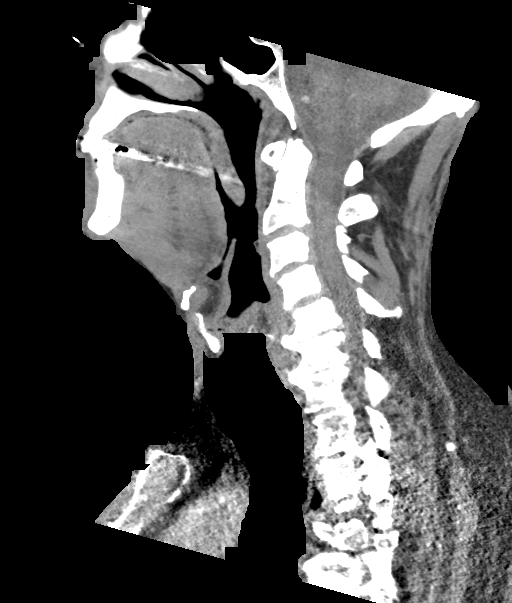
[im 48/95  bone]
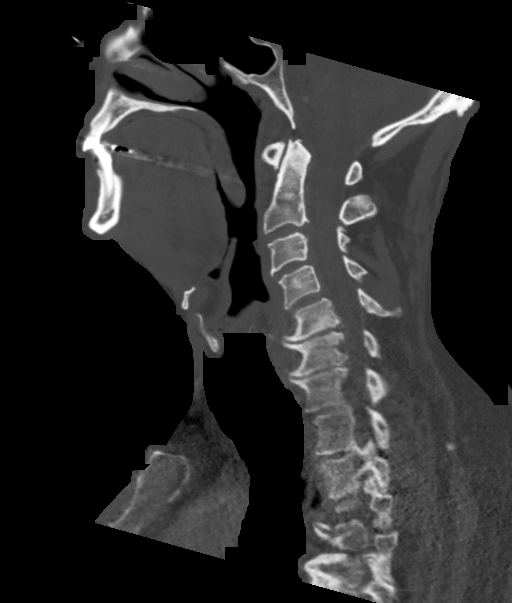
[im 55/95  bone]
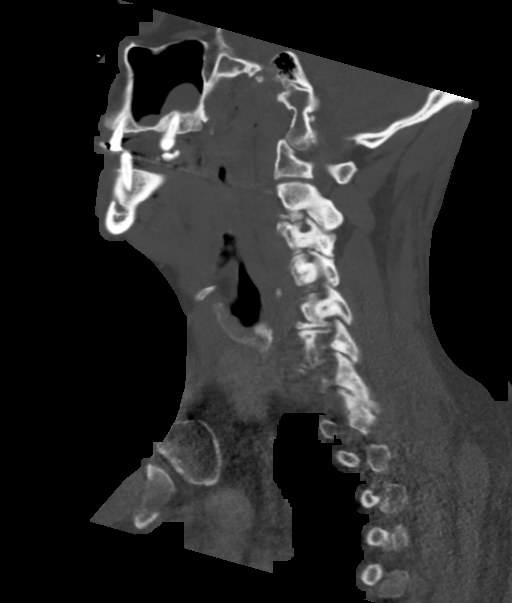
[im 63/95  bone]
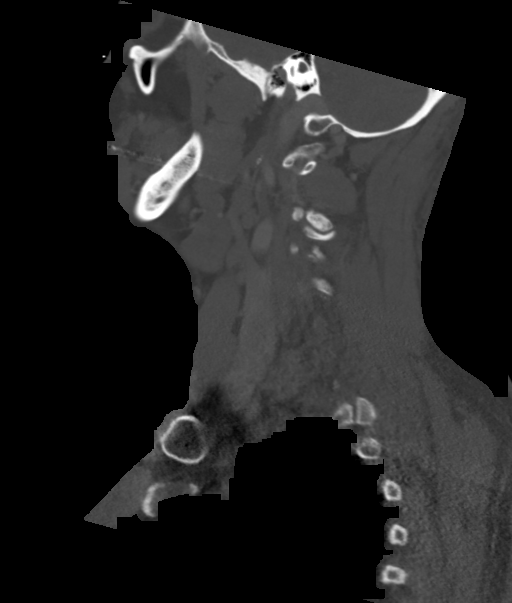

[Series 5: cor neck · coronal · 0.41mm/px · 3 of 117 slices shown]
[im 40/117  bone]
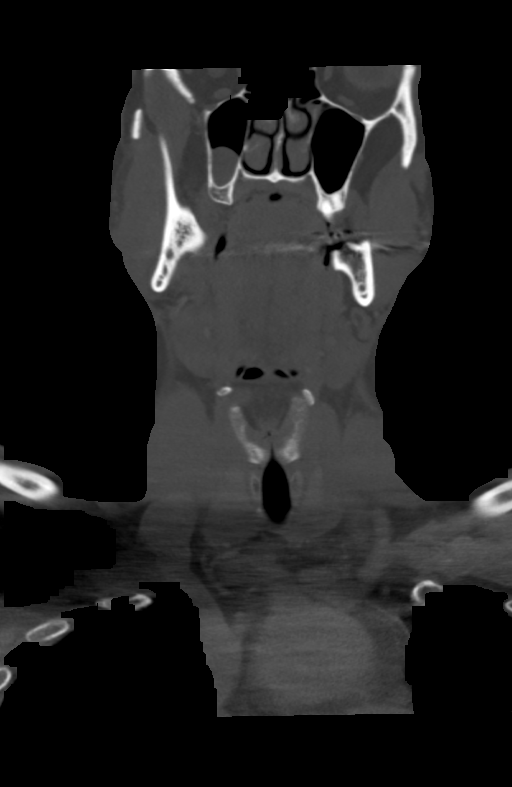
[im 52/117  bone]
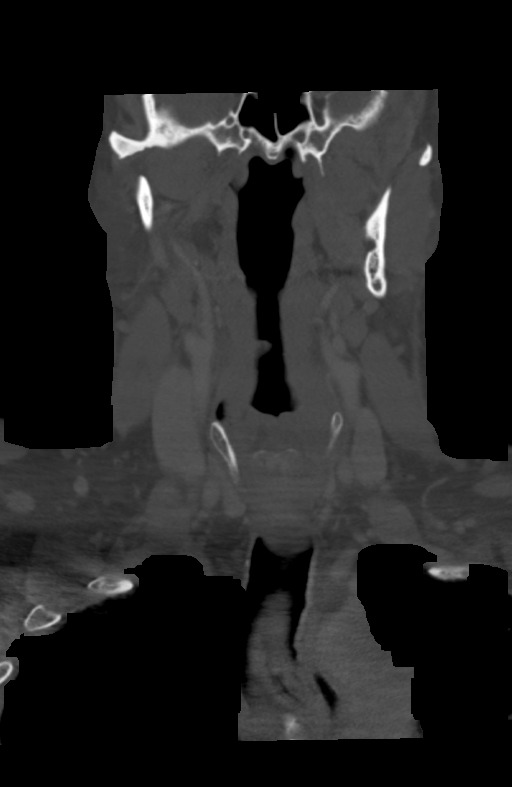
[im 65/117  bone]
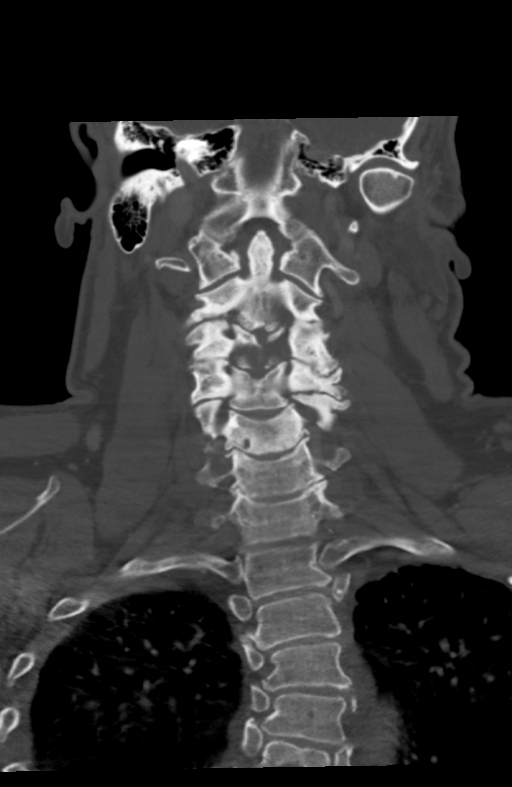

[Series 6: orthogonal ax · axial · 0.39mm/px · z∈[-308,-94]mm · 5 of 167 slices shown, 7 images]
[im 24/167  soft-tissue]
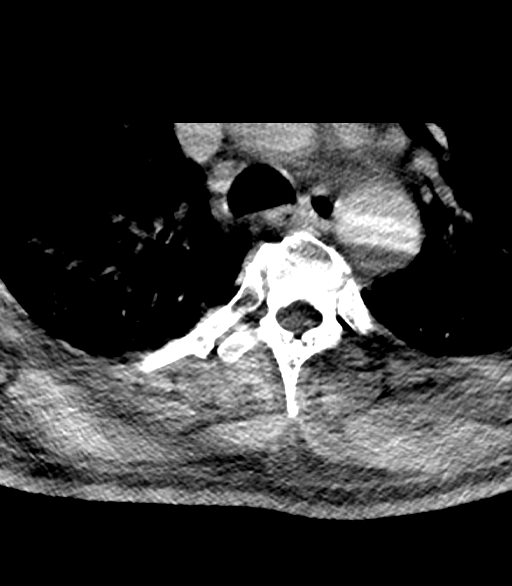
[im 24/167  bone]
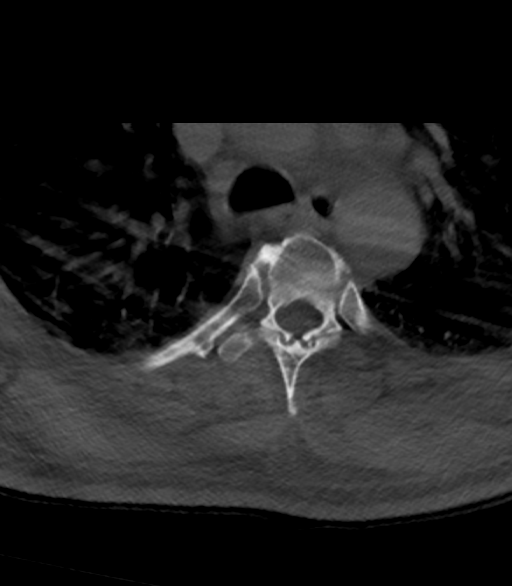
[im 48/167  bone]
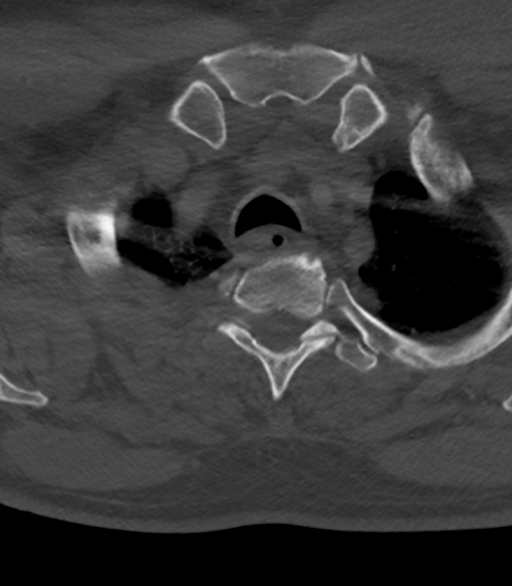
[im 95/167  bone]
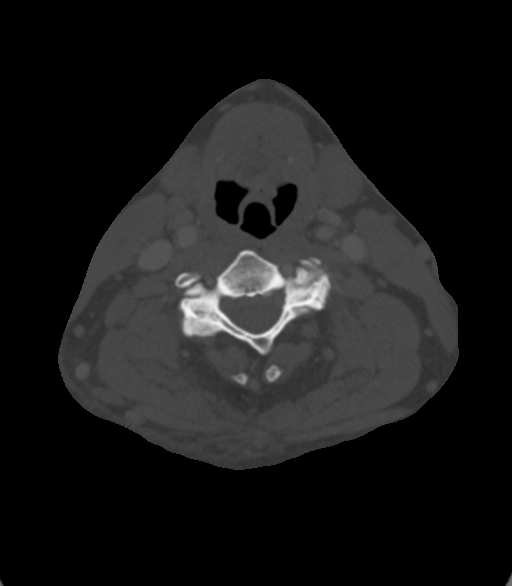
[im 119/167  bone]
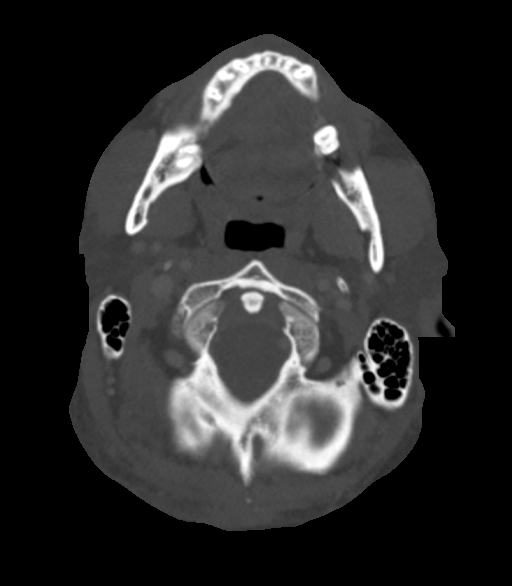
[im 143/167  soft-tissue]
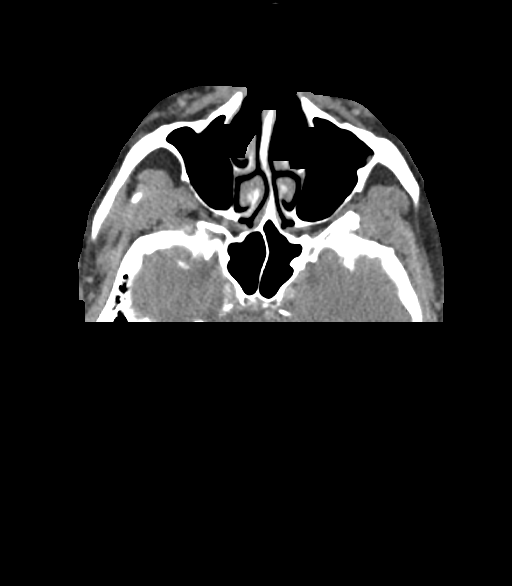
[im 143/167  bone]
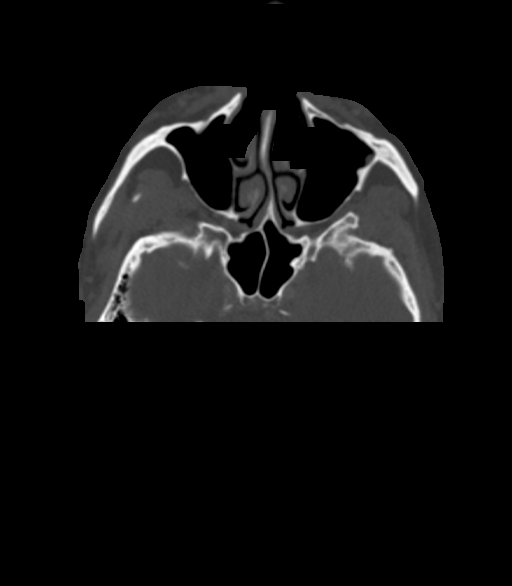

[13 of 33 positions shown; findings below may reference images not displayed]

FINDINGS: Pharynx and larynx: Normal

Salivary glands: Parotid and submandibular glands are normal.

Thyroid: Normal

Lymph nodes: No enlarged or low-density nodes on either side of the
neck.

Vascular: No abnormal vascular finding.

Limited intracranial: Normal

Visualized orbits: Normal

Mastoids and visualized paranasal sinuses: Retention cyst in the
right maxillary sinus. Otherwise clear.

Skeleton: Cervicothoracic curvature and degenerative changes.

Upper chest: Normal

Other: Nonspecific indistinct edema pattern in the right posterior
neck most consistent with cellulitis. No evidence of drainable
abscess. No sign of foreign object. Below the level marked as the
area of concern, there is a single posterior lymph node measuring 7
mm without suppuration.
IMPRESSION: Nonspecific edema pattern of the right posterior upper neck
consistent with cellulitis. No sign of drainable abscess or nodal
suppuration.

Otherwise negative soft tissue study of the neck. The patient has
curvature and degenerative change of the spine.

## 2022-05-06 ENCOUNTER — Encounter (HOSPITAL_BASED_OUTPATIENT_CLINIC_OR_DEPARTMENT_OTHER): Payer: Self-pay

## 2022-05-06 ENCOUNTER — Emergency Department (HOSPITAL_BASED_OUTPATIENT_CLINIC_OR_DEPARTMENT_OTHER)
Admission: EM | Admit: 2022-05-06 | Discharge: 2022-05-06 | Disposition: A | Discharge status: Home / Self Care | Payer: Medicare HMO | Attending: Emergency Medicine | Admitting: Emergency Medicine

## 2022-05-06 DIAGNOSIS — M79605 Pain in left leg: Secondary | ICD-10-CM | POA: Diagnosis not present

## 2022-05-06 DIAGNOSIS — Z7982 Long term (current) use of aspirin: Secondary | ICD-10-CM | POA: Diagnosis not present

## 2022-05-06 DIAGNOSIS — L539 Erythematous condition, unspecified: Secondary | ICD-10-CM | POA: Diagnosis not present

## 2022-05-06 MED ORDER — HYDROMORPHONE HCL 1 MG/ML IJ SOLN
1.0000 mg | Freq: Once | INTRAMUSCULAR | Status: AC
  Administered 2022-05-06: 1 mg via INTRAMUSCULAR
  Filled 2022-05-06: qty 1

## 2022-05-06 NOTE — ED Notes (Signed)
Pt. Reports he is on metformin for his diabetes and takes it off and on.  Pt. Reports he rarely takes his blood sugars.

## 2022-05-06 NOTE — ED Notes (Signed)
Pt. Reports he has had pain in his legs for 2 years and is having swelling in his lower legs today.  Pt. Reports he has had swelling in his lower legs off and on for years but today he is also having L hip and L upper leg pain.

## 2022-05-06 NOTE — ED Triage Notes (Signed)
Pt c/o left leg pain (upper leg, knee and hip) that started 2 years ago but about 5 days ago flared up and is getting worse.

## 2022-05-06 NOTE — Discharge Instructions (Signed)
Continue home medications.  Follow up with your Orthopaedist for recheck

## 2022-05-06 NOTE — ED Provider Notes (Signed)
[place] [facility] Provider Note   CSN: [id number] Arrival date & time: 05/06/22  1230     History  Chief Complaint  Patient presents with   Leg Pain    Mark Kelley is a 62 y.o. male.  Patient complains of pain in his left thigh and bilateral knees.  Patient reports he just had an MRI of his neck and back by orthopedist in [facility].  Patient reports he is waiting on the results.  Patient reports he came here today due to the pain.  Patient is requesting medication to help with the pain.  Patient is in pain management at [facility] he is taking 30 mg oxycodone.  Patient also request that I look at a rash on his legs that he has had for the past 2 years.  Patient reports he has seen his primary care and dermatology that gave him a cream to put on the rash.  Patient reports rash is not improving.  Patient denies any fever or chills he denies any cough or congestion patient reports he has not had any injuries to his hip or his leg.  Patient reports he used to get steroid injections in both knees but he has not had this done in an extended period of time  The history is provided by the patient. No language interpreter was used.  Leg Pain      Home Medications Prior to Admission medications   Medication Sig Start Date End Date Taking? Authorizing Provider  albuterol (PROVENTIL HFA;VENTOLIN HFA) 108 (90 Base) MCG/ACT inhaler Inhale 1-2 puffs into the lungs every 4 (four) hours as needed for wheezing or shortness of breath.    [provider]  amitriptyline (ELAVIL) 50 MG tablet Take 1 tablet (50 mg total) by mouth at bedtime. 09/09/17   Jola Schmidt, MD  aspirin EC 81 MG tablet Take 81 mg by mouth daily.    [provider]  Cholecalciferol (VITAMIN D PO) Take 1 tablet by mouth.    [provider]  famotidine (PEPCID) 40 MG tablet TK 1 T PO HS 08/20/18   [provider]  fenofibrate (TRICOR) 145 MG tablet  Take 145 mg by mouth daily.    [provider]  gabapentin (NEURONTIN) 600 MG tablet Take 600 mg by mouth 4 (four) times daily.     [provider]  lidocaine (LIDODERM) 5 % Place 1 patch onto the skin daily as needed (leg cramps). Remove & Discard patch within 12 hours or as directed by MD    [provider]  losartan-hydrochlorothiazide (HYZAAR) 100-12.5 MG tablet Take 1 tablet by mouth daily.    [provider]  metFORMIN (GLUCOPHAGE) 500 MG tablet Take 500 mg by mouth 2 (two) times daily with a meal.     [provider]  omeprazole (PRILOSEC) 40 MG capsule Take 40 mg by mouth daily.    [provider]  oxycodone (ROXICODONE) 30 MG immediate release tablet Take 30 mg by mouth 4 (four) times daily as needed for severe pain. 04/15/17   [provider]  OZEMPIC, 0.25 OR 0.5 MG/DOSE, 2 MG/1.5ML SOPN INJECT 0.25 MG WEEKLY FOR 2 WEEKS THEN 0.5 MG WEEKLY 08/23/18   [provider]  pravastatin (PRAVACHOL) 80 MG tablet Take 80 mg by mouth daily.    [provider]  sucralfate (CARAFATE) 1 g tablet Take 1 g by mouth daily.  04/12/17   [provider]  tiZANidine (ZANAFLEX)  4 MG tablet Take 1 tablet (4 mg total) by mouth every 6 (six) hours as needed for muscle spasms. 09/04/17   Shuford, Olivia Mackie, PA-C  zolpidem (AMBIEN) 10 MG tablet Take 10 mg by mouth at bedtime.  04/02/17   [provider]      Allergies    Morphine and related, Robaxin [methocarbamol], Solu-medrol [methylprednisolone], Toradol [ketorolac tromethamine], and Tramadol    Review of Systems   Review of Systems  All other systems reviewed and are negative.   Physical Exam Updated Vital Signs BP 127/83 (BP Location: Right Arm)   Pulse 81   Temp 98.2 F (36.8 C) (Oral)   Resp 17   Ht 6' (1.829 m)   Wt 99.8 kg   SpO2 98%   BMI 29.84 kg/m  Physical Exam Vitals and nursing note reviewed.  Constitutional:      Appearance: He is  well-developed.  HENT:     Head: Normocephalic.  Cardiovascular:     Rate and Rhythm: Normal rate.  Pulmonary:     Effort: Pulmonary effort is normal.  Abdominal:     General: There is no distension.  Musculoskeletal:        General: Normal range of motion.     Cervical back: Normal range of motion.  Skin:    Comments: Scattered erythematous areas bilateral lower extremities 1+ edema  Neurological:     General: No focal deficit present.     Mental Status: He is alert and oriented to person, place, and time.     ED Results / Procedures / Treatments   Labs (all labs ordered are listed, but only abnormal results are displayed) Labs Reviewed - No data to display  EKG None  Radiology No results found.  Procedures Procedures    Medications Ordered in ED Medications  HYDROmorphone (DILAUDID) injection 1 mg (has no administration in time range)    ED Course/ Medical Decision Making/ A&P                             Medical Decision Making Patient complains of pain in his left upper leg and bilateral knees.  Risk Prescription drug management. Risk Details: Patient offered x-rays and blood work.  Patient declined.  Patient states he is already had MRIs and that he recently had blood work.  Patient is requesting something for pain.  Patient is given an injection of Dilaudid I advised him that I cannot change his prescribed pain medication he will need to discuss this with his pain management doctor.  Patient is advised to recheck with dermatology regarding the rash on his lower extremities           Final Clinical Impression(s) / ED Diagnoses Final diagnoses:  Left leg pain    Rx / DC Orders ED Discharge Orders     None     An After Visit Summary was printed and given to the patient.     Fransico Meadow, [place] 05/06/22 1453    Regan Lemming, MD 05/06/22 [phone]

## 2022-06-18 ENCOUNTER — Emergency Department (HOSPITAL_COMMUNITY)
Admission: EM | Admit: 2022-06-18 | Discharge: 2022-06-18 | Disposition: A | Discharge status: Home / Self Care | Payer: Medicare HMO | Attending: Student | Admitting: Student

## 2022-06-18 ENCOUNTER — Encounter (HOSPITAL_COMMUNITY): Payer: Self-pay

## 2022-06-18 ENCOUNTER — Other Ambulatory Visit: Payer: Self-pay

## 2022-06-18 DIAGNOSIS — Z7982 Long term (current) use of aspirin: Secondary | ICD-10-CM | POA: Insufficient documentation

## 2022-06-18 DIAGNOSIS — M545 Low back pain, unspecified: Secondary | ICD-10-CM | POA: Diagnosis present

## 2022-06-18 MED ORDER — HYDROCODONE-ACETAMINOPHEN 5-325 MG PO TABS
2.0000 | ORAL_TABLET | Freq: Once | ORAL | Status: AC
  Administered 2022-06-18: 2 via ORAL
  Filled 2022-06-18: qty 2

## 2022-06-18 NOTE — ED Triage Notes (Signed)
Patient reports lower back pain radiating down bilateral legs. States he woke up this morning after staying with wife in hospital upstairs with pain. Has hx of back pain and arthritis, had MRI a couple months ago.

## 2022-06-18 NOTE — Discharge Instructions (Addendum)
Follow up with your primary care provider and your orthopedic specialist regarding management of your back pain. At home you may apply warm compress to the area up to 15 minutes at a time, ensure to place a barrier between your skin and the warm compress. Attached is information for the Neurosurgery, call and set up a follow up appointment regarding todays ED visit. Return to the ED if you are experiencing increasing/worsening symptoms.

## 2022-06-18 NOTE — ED Provider Notes (Signed)
Care transferred from Riki Sheer, PA-C at time of sign out. See their note for full assessment.   Briefly: Patient is 62 y.o. male with history of lumbar stenosis who presents to the ED with lower back pain.     Plan: Plan per previous PA-C: pending pain management and dispo home with follow up with his care team.  Labs Reviewed - No data to display  Clinical Course as of 06/18/22 0720  Wed Jun 18, 2022  0713 Pt evaluated and noted improvement of symptoms with treatment regimen. Discussed discharge treatment plan consisted of calling his care team so that way he can have his pain managed with his narcotics as per his pain team for his chronic pain.  Also will provide patient with information for [facility] for follow-up as needed.  Pt agreeable at this time. Pt appears safe for discharge. [SB]    Clinical Course User Index [SB] Zeplin Aleshire A, PA-C     Pt has been closely followed by Ortho Surg for his back pain. In depth conversation held with patient regarding his management of his pain in the outopatient setting needs to be managed by his care team. Pt agreeable and understanding at this time.  Patient provided with information for [facility] for follow-up as patient is requesting second opinion.  Supportive care and strict return precautions discussed with patient. Patient acknowledges and verbalizes understanding. Pt appears safe for discharge. Follow up as indicated in discharge paperwork.    This chart was dictated using voice recognition software, Dragon. Despite the best efforts of this provider to proofread and correct errors, errors may still occur which can change documentation meaning.   Flannery Cavallero A, PA-C 06/18/22 0720    Margarita Grizzle, MD 06/23/22 [phone]

## 2022-06-18 NOTE — ED Provider Notes (Signed)
[place] EMERGENCY DEPARTMENT AT [facility] Provider Note   CSN: [id number] Arrival date & time: 06/18/22  0554     History  Chief Complaint  Patient presents with   Back Pain   HPI Mark Kelley is a 62 y.o. male with chronic lower back pain presenting for back pain. Symptoms have been going on for couple years now. They have progressively worsened in the last year.  Patient was upstairs overnight sleeping on a "windowsill bed" while wife is here admitted in the hospital. Back pain was much worse overnight.  Back pain is in the lower back and radiates down both legs at times denies any recent trauma to his back. Did have a MRI of his lumbar spine a month ago which revealed significant lumbar stenosis.  States he normally takes 20 mg of oxycodone every 4 hours for his back pain. States he does have follow-up with a neurosurgeon who has told him that he should consider surgery for his lower back.  Denies saddle anesthesia, fever, urinary or bowel incontinence.   Back Pain      Home Medications Prior to Admission medications   Medication Sig Start Date End Date Taking? Authorizing Provider  albuterol (PROVENTIL HFA;VENTOLIN HFA) 108 (90 Base) MCG/ACT inhaler Inhale 1-2 puffs into the lungs every 4 (four) hours as needed for wheezing or shortness of breath.    [provider]  amitriptyline (ELAVIL) 50 MG tablet Take 1 tablet (50 mg total) by mouth at bedtime. 09/09/17   Azalia Bilis, MD  aspirin EC 81 MG tablet Take 81 mg by mouth daily.    [provider]  Cholecalciferol (VITAMIN D PO) Take 1 tablet by mouth.    [provider]  famotidine (PEPCID) 40 MG tablet TK 1 T PO HS 08/20/18   [provider]  fenofibrate (TRICOR) 145 MG tablet Take 145 mg by mouth daily.    [provider]  gabapentin (NEURONTIN) 600 MG tablet Take 600 mg by mouth 4 (four) times daily.     [provider]  lidocaine (LIDODERM) 5 % Place 1  patch onto the skin daily as needed (leg cramps). Remove & Discard patch within 12 hours or as directed by MD    [provider]  losartan-hydrochlorothiazide (HYZAAR) 100-12.5 MG tablet Take 1 tablet by mouth daily.    [provider]  metFORMIN (GLUCOPHAGE) 500 MG tablet Take 500 mg by mouth 2 (two) times daily with a meal.     [provider]  omeprazole (PRILOSEC) 40 MG capsule Take 40 mg by mouth daily.    [provider]  oxycodone (ROXICODONE) 30 MG immediate release tablet Take 30 mg by mouth 4 (four) times daily as needed for severe pain. 04/15/17   [provider]  OZEMPIC, 0.25 OR 0.5 MG/DOSE, 2 MG/1.5ML SOPN INJECT 0.25 MG WEEKLY FOR 2 WEEKS THEN 0.5 MG WEEKLY 08/23/18   [provider]  pravastatin (PRAVACHOL) 80 MG tablet Take 80 mg by mouth daily.    [provider]  sucralfate (CARAFATE) 1 g tablet Take 1 g by mouth daily.  04/12/17   [provider]  tiZANidine (ZANAFLEX) 4 MG tablet Take 1 tablet (4 mg total) by mouth every 6 (six) hours as needed for muscle spasms. 09/04/17   Shuford, French Ana, PA-C  zolpidem (AMBIEN) 10 MG tablet Take 10 mg by mouth at bedtime.  04/02/17   [provider]      Allergies  Fentanyl, Morphine and related, Robaxin [methocarbamol], Solu-medrol [methylprednisolone], Toradol [ketorolac tromethamine], and Tramadol    Review of Systems   Review of Systems  Musculoskeletal:  Positive for back pain.    Physical Exam   Vitals:   06/18/22 0605  BP: 125/75  Pulse: 84  Resp: 18  Temp: 98.1 F (36.7 C)  SpO2: 95%    CONSTITUTIONAL:  well-appearing, NAD NEURO:  Alert and oriented x 3, CN 3-12 grossly intact EYES:  eyes equal and reactive ENT/NECK:  Supple, no stridor  CARDIO:  appears well-perfused PULM:  No respiratory distress GI/GU:  non-distended, soft, non tender MSK/SPINE: Paraspinal and midline lumbar tenderness, gait is steady, pain with flexion SKIN:  no rash,  atraumatic  *Additional and/or pertinent findings included in MDM below  ED Results / Procedures / Treatments   Labs (all labs ordered are listed, but only abnormal results are displayed) Labs Reviewed - No data to display  EKG None  Radiology No results found.  Procedures Procedures    Medications Ordered in ED Medications  HYDROcodone-acetaminophen (NORCO/VICODIN) 5-325 MG per tablet 2 tablet (2 tablets Oral Given 06/18/22 0636)    ED Course/ Medical Decision Making/ A&P                             Medical Decision Making  62 year old male who is well-appearing presenting for chronic lower back pain.  Exam remarkable for lumbar midline parasternal tenderness and pain with active range of motion in any direction of the back.  Overall patient looks clinically well with no red flag symptoms today.  Treat his pain with Norco.  Signed out patient [employer], PA.  Disposition is likely discharge with PCP follow-up for his chronic low back pain.        Final Clinical Impression(s) / ED Diagnoses Final diagnoses:  Low back pain, unspecified back pain laterality, unspecified chronicity, unspecified whether sciatica present    Rx / DC Orders ED Discharge Orders     None         Gareth Eagle, PA-C 06/18/22 0650    Glendora Score, MD 06/18/22 1755

## 2022-07-29 ENCOUNTER — Ambulatory Visit: Payer: Medicare HMO | Admitting: Medical

## 2023-02-07 ENCOUNTER — Emergency Department (HOSPITAL_BASED_OUTPATIENT_CLINIC_OR_DEPARTMENT_OTHER)
Admission: EM | Admit: 2023-02-07 | Discharge: 2023-02-07 | Disposition: A | Discharge status: Home / Self Care | Payer: Medicare HMO | Attending: Emergency Medicine | Admitting: Emergency Medicine

## 2023-02-07 ENCOUNTER — Other Ambulatory Visit: Payer: Self-pay

## 2023-02-07 ENCOUNTER — Encounter (HOSPITAL_BASED_OUTPATIENT_CLINIC_OR_DEPARTMENT_OTHER): Payer: Self-pay

## 2023-02-07 ENCOUNTER — Emergency Department (HOSPITAL_BASED_OUTPATIENT_CLINIC_OR_DEPARTMENT_OTHER): Payer: Medicare HMO

## 2023-02-07 DIAGNOSIS — R102 Pelvic and perineal pain: Secondary | ICD-10-CM

## 2023-02-07 DIAGNOSIS — R1030 Lower abdominal pain, unspecified: Secondary | ICD-10-CM | POA: Insufficient documentation

## 2023-02-07 DIAGNOSIS — Z7982 Long term (current) use of aspirin: Secondary | ICD-10-CM | POA: Diagnosis not present

## 2023-02-07 LAB — CBC
HCT: 47.6 % (ref 39.0–52.0)
Hemoglobin: 16.6 g/dL (ref 13.0–17.0)
MCH: 30.2 pg (ref 26.0–34.0)
MCHC: 34.9 g/dL (ref 30.0–36.0)
MCV: 86.5 fL (ref 80.0–100.0)
Platelets: 254 10*3/uL (ref 150–400)
RBC: 5.5 MIL/uL (ref 4.22–5.81)
RDW: 13.1 % (ref 11.5–15.5)
WBC: 8.1 10*3/uL (ref 4.0–10.5)
nRBC: 0 % (ref 0.0–0.2)

## 2023-02-07 LAB — COMPREHENSIVE METABOLIC PANEL
ALT: 35 U/L (ref 0–44)
AST: 31 U/L (ref 15–41)
Albumin: 4.3 g/dL (ref 3.5–5.0)
Alkaline Phosphatase: 63 U/L (ref 38–126)
Anion gap: 9 (ref 5–15)
BUN: 28 mg/dL — ABNORMAL HIGH (ref 8–23)
CO2: 26 mmol/L (ref 22–32)
Calcium: 9.5 mg/dL (ref 8.9–10.3)
Chloride: 101 mmol/L (ref 98–111)
Creatinine, Ser: 1.82 mg/dL — ABNORMAL HIGH (ref 0.61–1.24)
GFR, Estimated: 41 mL/min — ABNORMAL LOW (ref >60)
Glucose, Bld: 169 mg/dL — ABNORMAL HIGH (ref 70–99)
Potassium: 3.5 mmol/L (ref 3.5–5.1)
Sodium: 136 mmol/L (ref 135–145)
Total Bilirubin: 0.6 mg/dL (ref <1.2)
Total Protein: 7.2 g/dL (ref 6.5–8.1)

## 2023-02-07 LAB — URINALYSIS, ROUTINE W REFLEX MICROSCOPIC
Bilirubin Urine: NEGATIVE
Glucose, UA: 100 mg/dL — AB
Hgb urine dipstick: NEGATIVE
Ketones, ur: NEGATIVE mg/dL
Leukocytes,Ua: NEGATIVE
Nitrite: NEGATIVE
Protein, ur: NEGATIVE mg/dL
Specific Gravity, Urine: 1.02 (ref 1.005–1.030)
pH: 7 (ref 5.0–8.0)

## 2023-02-07 LAB — TROPONIN I (HIGH SENSITIVITY): Troponin I (High Sensitivity): <2 ng/L (ref <18)

## 2023-02-07 LAB — LIPASE, BLOOD: Lipase: 36 U/L (ref 11–51)

## 2023-02-07 MED ORDER — ACETAMINOPHEN 500 MG PO TABS
1000.0000 mg | ORAL_TABLET | Freq: Once | ORAL | Status: AC
  Administered 2023-02-07: 1000 mg via ORAL
  Filled 2023-02-07: qty 2

## 2023-02-07 MED ORDER — ONDANSETRON HCL 4 MG/2ML IJ SOLN
4.0000 mg | Freq: Once | INTRAMUSCULAR | Status: AC
  Administered 2023-02-07: 4 mg via INTRAVENOUS
  Filled 2023-02-07: qty 2

## 2023-02-07 MED ORDER — OXYCODONE-ACETAMINOPHEN 5-325 MG PO TABS: 1.0000 | ORAL_TABLET | Freq: Four times a day (QID) | ORAL | 0 refills | Status: AC | PRN

## 2023-02-07 MED ORDER — ALUM & MAG HYDROXIDE-SIMETH 200-200-20 MG/5ML PO SUSP
30.0000 mL | Freq: Once | ORAL | Status: AC
  Administered 2023-02-07: 30 mL via ORAL
  Filled 2023-02-07: qty 30

## 2023-02-07 MED ORDER — IOHEXOL 300 MG/ML  SOLN
60.0000 mL | Freq: Once | INTRAMUSCULAR | Status: AC | PRN
  Administered 2023-02-07: 60 mL via INTRAVENOUS

## 2023-02-07 NOTE — ED Provider Notes (Signed)
[place] [facility] Provider Note   CSN: [id number] Arrival date & time: 02/07/23  1046     History  Chief Complaint  Patient presents with   Abdominal Pain    Mark Kelley is a 62 y.o. male who presents with suprapubic pain for the past 2 weeks.  No injury or trauma.  No dysuria.  States he did have a bout of 3 days of increased frequency.  No vomiting or diarrhea.  Reports he is status post bilateral inguinal hernia repairs roughly 8 years ago and he is curious whether there is any relation with his current symptoms.  He is accompanied by his wife who states recently when they make love, she experiences some dysuria.  He denies any penile discharge or scrotal pain.  He does additionally report acid reflux like symptoms, states this is typical for him.  He takes omeprazole regularly.  He is requesting antiacids.  He denies any chest pain or shortness of breath.       Home Medications Prior to Admission medications   Medication Sig Start Date End Date Taking? Authorizing Provider  oxyCODONE-acetaminophen (PERCOCET/ROXICET) 5-325 MG tablet Take 1 tablet by mouth every 6 (six) hours as needed for severe pain (pain score 7-10). 02/07/23  Yes Halford Decamp, PA-C  albuterol (PROVENTIL HFA;VENTOLIN HFA) 108 (90 Base) MCG/ACT inhaler Inhale 1-2 puffs into the lungs every 4 (four) hours as needed for wheezing or shortness of breath.    [provider]  amitriptyline (ELAVIL) 50 MG tablet Take 1 tablet (50 mg total) by mouth at bedtime. 09/09/17   Azalia Bilis, MD  aspirin EC 81 MG tablet Take 81 mg by mouth daily.    [provider]  Cholecalciferol (VITAMIN D PO) Take 1 tablet by mouth.    [provider]  famotidine (PEPCID) 40 MG tablet TK 1 T PO HS 08/20/18   [provider]  fenofibrate (TRICOR) 145 MG tablet Take 145 mg by mouth daily.    [provider]  gabapentin (NEURONTIN) 600 MG tablet Take  600 mg by mouth 4 (four) times daily.     [provider]  lidocaine (LIDODERM) 5 % Place 1 patch onto the skin daily as needed (leg cramps). Remove & Discard patch within 12 hours or as directed by MD    [provider]  losartan-hydrochlorothiazide (HYZAAR) 100-12.5 MG tablet Take 1 tablet by mouth daily.    [provider]  metFORMIN (GLUCOPHAGE) 500 MG tablet Take 500 mg by mouth 2 (two) times daily with a meal.     [provider]  omeprazole (PRILOSEC) 40 MG capsule Take 40 mg by mouth daily.    [provider]  OZEMPIC, 0.25 OR 0.5 MG/DOSE, 2 MG/1.5ML SOPN INJECT 0.25 MG WEEKLY FOR 2 WEEKS THEN 0.5 MG WEEKLY 08/23/18   [provider]  pravastatin (PRAVACHOL) 80 MG tablet Take 80 mg by mouth daily.    [provider]  sucralfate (CARAFATE) 1 g tablet Take 1 g by mouth daily.  04/12/17   [provider]  tiZANidine (ZANAFLEX) 4 MG tablet Take 1 tablet (4 mg total) by mouth every 6 (six) hours as needed for muscle spasms. 09/04/17   Shuford, French Ana, PA-C  zolpidem (AMBIEN) 10 MG tablet Take 10 mg by mouth at bedtime.  04/02/17   [provider]      Allergies    Fentanyl, Morphine and codeine, Robaxin [methocarbamol], Solu-medrol [methylprednisolone], Toradol [employer]  tromethamine], and Tramadol    Review of Systems   Review of Systems  Gastrointestinal:  Positive for abdominal pain.    Physical Exam Updated Vital Signs BP 118/81 (BP Location: Right Arm)   Pulse 68   Temp 97.7 F (36.5 C) (Oral)   Resp 18   Ht 6' (1.829 m)   Wt 99 kg   SpO2 98%   BMI 29.60 kg/m  Physical Exam Vitals and nursing note reviewed. Exam conducted with a chaperone present.  Constitutional:      General: He is not in acute distress.    Appearance: He is well-developed.  HENT:     Head: Normocephalic and atraumatic.  Eyes:     Conjunctiva/sclera: Conjunctivae normal.  Cardiovascular:     Rate and Rhythm: Normal rate and  regular rhythm.     Heart sounds: No murmur heard. Pulmonary:     Effort: Pulmonary effort is normal. No respiratory distress.     Breath sounds: Normal breath sounds.  Abdominal:     Palpations: Abdomen is soft.     Tenderness: There is no abdominal tenderness.     Comments: Mild suprapubic tenderness, no rebound, guarding or peritoneal signs, no hernia, no scrotal swelling or pain, no penile discharge  Musculoskeletal:        General: No swelling.     Cervical back: Neck supple.  Skin:    General: Skin is warm and dry.     Capillary Refill: Capillary refill takes less than 2 seconds.  Neurological:     Mental Status: He is alert.  Psychiatric:        Mood and Affect: Mood normal.     ED Results / Procedures / Treatments   Labs (all labs ordered are listed, but only abnormal results are displayed) Labs Reviewed  COMPREHENSIVE METABOLIC PANEL - Abnormal; Notable for the following components:      Result Value   Glucose, Bld 169 (*)    BUN 28 (*)    Creatinine, Ser 1.82 (*)    GFR, Estimated 41 (*)    All other components within normal limits  URINALYSIS, ROUTINE W REFLEX MICROSCOPIC - Abnormal; Notable for the following components:   Glucose, UA 100 (*)    All other components within normal limits  LIPASE, BLOOD  CBC  TROPONIN I (HIGH SENSITIVITY)    EKG EKG Interpretation Date/Time:  Saturday February 07 2023 11:06:39 EST Ventricular Rate:  87 PR Interval:  168 QRS Duration:  145 QT Interval:  384 QTC Calculation: 462 R Axis:   -71  Text Interpretation: 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 RADIATION DOSE REDUCTION: This exam was performed according to the departmental dose-optimization program which includes automated exposure control, adjustment of the mA and/or kV according to patient size and/or use of iterative reconstruction technique. CONTRAST:  60mL OMNIPAQUE IOHEXOL 300 MG/ML  SOLN COMPARISON:  Ultrasound 04/21/2017, 04/20/2017 CT abdomen pelvis, CT 07/15/2021 report FINDINGS: Lower chest: Lung bases demonstrate no acute airspace disease. Mild reticular changes in the posterior bases consistent with scarring. Small hiatal hernia Hepatobiliary: No calcified gallstone. No  biliary dilatation. No focal hepatic abnormality. Pancreas: Unremarkable. No pancreatic ductal dilatation or surrounding inflammatory changes. Spleen: Normal in size without focal abnormality. Adrenals/Urinary Tract: Adrenal glands are normal. Kidneys show no hydronephrosis. Nonspecific left greater than right perinephric stranding. Subcentimeter hypodensity mid to lower left kidney too small to further characterize, no specific imaging follow-up is recommended. The bladder is unremarkable. Stomach/Bowel: Stomach within normal limits. No dilated small bowel. No acute bowel wall thickening. Negative appendix. Vascular/Lymphatic: Mild aortic atherosclerosis. No aneurysm. No suspicious lymph nodes. Reproductive: Negative prostate Other: Evidence of prior inguinal hernia repair. Asymmetric fat in the left inguinal canal. This is unchanged. Musculoskeletal: Scoliosis and degenerative changes. No acute osseous abnormality IMPRESSION: 1. No CT evidence for acute intra-abdominal or pelvic abnormality. 2. Nonspecific bilateral perinephric stranding, left greater than right which may be correlated with urinalysis. 3. Small hiatal hernia. 4. Aortic atherosclerosis. Aortic Atherosclerosis (ICD10-I70.0). Electronically Signed   By: Jasmine Pang M.D.   On:  02/07/2023 15:35    Procedures Procedures    Medications Ordered in ED Medications  alum & mag hydroxide-simeth (MAALOX/MYLANTA) 200-200-20 MG/5ML suspension 30 mL (30 mLs Oral Given 02/07/23 1142)  acetaminophen (TYLENOL) tablet 1,000 mg (1,000 mg Oral Given 02/07/23 1255)  iohexol (OMNIPAQUE) 300 MG/ML solution 60 mL (60 mLs Intravenous Contrast Given 02/07/23 1329)  ondansetron (ZOFRAN) injection 4 mg (4 mg Intravenous Given 02/07/23 1352)    ED Course/ Medical Decision Making/ A&P                                 Medical Decision Making Amount and/or Complexity of Data Reviewed Labs: ordered. Radiology: ordered.  Risk OTC drugs.   This patient presents to the ED with chief complaint(s) of suprapubic pain.  The complaint involves an extensive differential diagnosis and also carries with it a high risk of complications and morbidity.   pertinent past medical history as listed in HPI  The differential diagnosis includes  UTI, prostatitis, urinary retention, appendicitis, diverticulitis The initial plan is to  Team basic labs, UA, will give antiacids for his current symptoms,  troponin given EKG changes Additional history obtained: Additional history obtained from spouse Records reviewed previous admission documents  Initial Assessment:   Patient is overall well-appearing, vitals are stable.  He continues to urinate, no concern for retention.  I do not appreciate recurrence of hernia today.  No dysuria but felt of increased frequency concerning for UTI.  Also concerning for prostatitis  Independent ECG interpretation:  Sinus rhythm with new right bundle branch block compared to prior EKG  Independent labs interpretation:  The following labs were independently interpreted:  CBC unremarkable, UA negative leuks, negative nitrates, 100 glucose  Independent visualization and interpretation of imaging: I independently visualized the following imaging with scope of  interpretation limited to determining acute life threatening conditions related to emergency care: CT abdomen pelvis, which revealed no acute abnormality, some nonspecific bilateral perinephritic stranding, hiatal hernia.  Treatment and Reassessment: Patient given GI cocktail for acid reflux symptoms upon upon first assessment  Upon reassessment approximately 12:30 PM patient with persistent symptoms since requesting something for pain.  He has many allergies, will give Tylenol 1000 mg at this time.  Discussed negative lab work thus far.  Will obtain CT abdomen pelvis.  Discussed RBBB changes in EKG, encourage patient to follow-up with primary care provider for further management.  1:40 PM patient reporting feeling nauseous, given Zofran IV 4 mg  Patient with some persistent pain upon reevaluation. CT imaging still pending.    4 PM discussed negative workup with patient.  Discussed discharge plan with patient including urology and PCP follow-up.  Patient and his wife are agreeable.  Consultations obtained:   none  Disposition:   Patient will be discharged home with short supply of pain medication.  Provided contact information for urology follow-up. Additionally encouraged to follow-up with primary care provider during the interim. The patient has been appropriately medically screened and/or stabilized in the ED. I have low suspicion for any other emergent medical condition which would require further screening, evaluation or treatment in the ED or require inpatient management. At time of discharge the patient is hemodynamically stable and in no acute distress. I have discussed work-up results and diagnosis with patient and answered all questions. Patient is agreeable with discharge plan. We discussed strict return precautions for returning to the emergency department and they verbalized understanding.     Social Determinants of Health:   none  This note was dictated with voice recognition  software.  Despite best efforts at proofreading, errors may have occurred which can change the documentation meaning.          Final Clinical Impression(s) / ED Diagnoses Final diagnoses:  Suprapubic abdominal pain    Rx / DC Orders ED Discharge Orders          Ordered    oxyCODONE-acetaminophen (PERCOCET/ROXICET) 5-325 MG tablet  Every 6 hours PRN        02/07/23 1556              Fabienne Bruns 02/07/23 1609    Jacalyn Lefevre, MD 02/08/23 1104

## 2023-02-07 NOTE — ED Triage Notes (Signed)
The patient is having lower abd pain for 2 weeks. No pain when he urinates. No V/D or fever.

## 2023-02-07 NOTE — Discharge Instructions (Addendum)
It was a pleasure taking care of you today.  You were evaluated in the emergency room for suprapubic pain and pressure.  Your lab work did not show any significant abnormality.  There was evidence of chronic kidney disease and some inflammation around your kidneys on CT scan however no acute abnormality.  A prescription for pain medication was sent into your pharmacy.  Please avoid driving or operating heavy machinery while using this medication.  You are provided contact information for urology please make an appointment within the next week or 2.  Additionally please follow-up with your primary care provider within the next 3 to 5 days.  If you experience any new or worsening symptoms including worsening pain, persistent vomiting, difficulty urinating please return to the emergency room.

## 2023-02-07 NOTE — ED Notes (Addendum)
Pt reported to me that another staff member had removed his IV; his jacket was on and arms not visible; discharge instructions discussed, pt verbalized understanding; after pt left it was discovered no other staff member removed his IV; there is also no evidence of saline lock in the room or trash.

## 2023-12-11 ENCOUNTER — Encounter (HOSPITAL_BASED_OUTPATIENT_CLINIC_OR_DEPARTMENT_OTHER): Payer: Self-pay

## 2023-12-11 ENCOUNTER — Emergency Department (HOSPITAL_BASED_OUTPATIENT_CLINIC_OR_DEPARTMENT_OTHER)

## 2023-12-11 ENCOUNTER — Other Ambulatory Visit: Payer: Self-pay

## 2023-12-11 ENCOUNTER — Emergency Department (HOSPITAL_BASED_OUTPATIENT_CLINIC_OR_DEPARTMENT_OTHER)
Admission: EM | Admit: 2023-12-11 | Discharge: 2023-12-11 | Disposition: A | Discharge status: Home / Self Care | Attending: Emergency Medicine | Admitting: Emergency Medicine

## 2023-12-11 DIAGNOSIS — G8929 Other chronic pain: Secondary | ICD-10-CM | POA: Diagnosis not present

## 2023-12-11 DIAGNOSIS — F17210 Nicotine dependence, cigarettes, uncomplicated: Secondary | ICD-10-CM | POA: Insufficient documentation

## 2023-12-11 DIAGNOSIS — M545 Low back pain, unspecified: Secondary | ICD-10-CM | POA: Insufficient documentation

## 2023-12-11 DIAGNOSIS — I10 Essential (primary) hypertension: Secondary | ICD-10-CM | POA: Insufficient documentation

## 2023-12-11 DIAGNOSIS — Z79899 Other long term (current) drug therapy: Secondary | ICD-10-CM | POA: Diagnosis not present

## 2023-12-11 DIAGNOSIS — Z7984 Long term (current) use of oral hypoglycemic drugs: Secondary | ICD-10-CM | POA: Insufficient documentation

## 2023-12-11 DIAGNOSIS — Z7982 Long term (current) use of aspirin: Secondary | ICD-10-CM | POA: Insufficient documentation

## 2023-12-11 DIAGNOSIS — J449 Chronic obstructive pulmonary disease, unspecified: Secondary | ICD-10-CM | POA: Insufficient documentation

## 2023-12-11 DIAGNOSIS — E119 Type 2 diabetes mellitus without complications: Secondary | ICD-10-CM | POA: Diagnosis not present

## 2023-12-11 MED ORDER — ONDANSETRON 4 MG PO TBDP
4.0000 mg | ORAL_TABLET | Freq: Once | ORAL | Status: AC
  Administered 2023-12-11: 4 mg via ORAL
  Filled 2023-12-11: qty 1

## 2023-12-11 MED ORDER — HYDROMORPHONE HCL 1 MG/ML IJ SOLN
1.0000 mg | Freq: Once | INTRAMUSCULAR | Status: AC
  Administered 2023-12-11: 1 mg via INTRAMUSCULAR
  Filled 2023-12-11: qty 1

## 2023-12-11 MED ORDER — LIDOCAINE 5 % EX PTCH
1.0000 | MEDICATED_PATCH | Freq: Once | CUTANEOUS | Status: DC
  Administered 2023-12-11: 1 via TRANSDERMAL
  Filled 2023-12-11: qty 1

## 2023-12-11 NOTE — ED Notes (Signed)
D/c paperwork reviewed with pt, including follow up care.  No questions or concerns voiced at time of d/c. . Pt verbalized understanding, Ambulatory without assistance to ED exit, NAD.   

## 2023-12-11 NOTE — Discharge Instructions (Addendum)
 Please follow-up with your Primary Care Physician within the next week. Please take your medications as instructed and discuss any changes to your medications with your primary care physician.    Please return to the Emergency Department if you have any leg numbness, leg weakness, difficulty walking, fevers, worsening of pain, lightheadedness, lose consciousness, severe abdominal pain, severe headache, difficulty urinating, or difficulty having a bowel movement.  Recommend you cut back on your tobacco use   Please return to the emergency department immediately for any new or concerning symptoms, or if you get worse.

## 2023-12-11 NOTE — ED Provider Notes (Signed)
  EMERGENCY DEPARTMENT AT [facility] Provider Note  CSN: [id number] Arrival date & time: 12/11/23 1122  Chief Complaint(s) Back Pain  HPI Mark Kelley is a 63 y.o. male with past medical history as below, significant for COPD, DM2, GERD, HLD, HTN, status post bilateral L5-S1 transforaminal lumbar interbody fusion and bone graft Dr. Marlyce @[facility]  Who presents to the ED with complaint of low back pain  He had surgery on 9/3 lumbar sacral interbody fusion at [facility].  He was seen in the ER on September 20 and September 27 with low back pain.  Workup on 9/20 was reassuring and he was advised to follow back up with spine specialist, pain management.  On 9/27 in the ER he had repeat CT scan which was reassuring.  There was increased suspicion for drug-seeking behavior as patient was misleading about his narcotic prescriptions.  On 10/3 he was prescribed 150 tablets of oxycodone  20 mg  Patient presents to the ER today with ongoing low back pain.  No recent falls, no fevers or chills, change in bowel or bladder function, no incontinence or overload.  He has follow-up with his spine specialist later this month.  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 Authorizing Provider  albuterol  (PROVENTIL  HFA;VENTOLIN  HFA) 108 (90 Base) MCG/ACT inhaler Inhale 1-2 puffs into the lungs every 4 (four) hours as needed for wheezing or shortness of breath.     [provider]  amitriptyline  (ELAVIL ) 50 MG tablet Take 1 tablet (50 mg total) by mouth at bedtime. 09/09/17   Baxter Drivers, MD  aspirin  EC 81 MG tablet Take 81 mg by mouth daily.    [provider]  Cholecalciferol (VITAMIN D PO) Take 1 tablet by mouth.    [provider]  famotidine  (PEPCID ) 40 MG tablet TK 1 T PO HS 08/20/18   [provider]  fenofibrate (TRICOR) 145 MG tablet Take 145 mg by mouth daily.    [provider]  gabapentin  (NEURONTIN ) 600 MG tablet Take 600 mg by mouth 4 (four) times daily.     [provider]  lidocaine  (LIDODERM ) 5 % Place 1 patch onto the skin daily as needed (leg cramps). Remove & Discard patch within 12 hours or as directed by MD    [provider]  losartan -hydrochlorothiazide  (HYZAAR) 100-12.5 MG tablet Take 1 tablet by mouth daily.    [provider]  metFORMIN (GLUCOPHAGE) 500 MG tablet Take 500 mg by mouth 2 (two) times daily with a meal.     [provider]  omeprazole (PRILOSEC) 40 MG capsule Take 40 mg by mouth daily.    [provider]  oxyCODONE -acetaminophen  (PERCOCET/ROXICET) 5-325 MG tablet Take 1 tablet by mouth every 6 (six) hours as needed for severe pain (pain score 7-10). 02/07/23   Donnajean Lynwood DEL, PA-C  OZEMPIC, 0.25 OR 0.5 MG/DOSE, 2 MG/1.5ML SOPN INJECT 0.25  MG WEEKLY FOR 2 WEEKS THEN 0.5 MG WEEKLY 08/23/18   [provider]  pravastatin  (PRAVACHOL ) 80 MG tablet Take 80 mg by mouth daily.    [provider]  sucralfate  (CARAFATE ) 1 g tablet Take 1 g by mouth daily.  04/12/17   [provider]  tiZANidine  (ZANAFLEX ) 4 MG tablet Take 1 tablet (4 mg total) by mouth every 6 (six) hours as needed for muscle spasms. 09/04/17   Shuford, Randine, PA-C  zolpidem  (AMBIEN ) 10 MG tablet Take 10 mg by mouth at bedtime.  04/02/17   [provider]                                                                                                                                     Past Surgical History Past Surgical History:  Procedure Laterality Date   CARDIAC CATHETERIZATION     2005   HERNIA REPAIR     JOINT REPLACEMENT     KNEE ARTHROPLASTY     KNEE ARTHROSCOPY     Bilateral   REVISION TOTAL SHOULDER TO REVERSE TOTAL SHOULDER Right 09/03/2017   Procedure: Removal of right total shoulder arthroplasty and conversion to reverse shoulder arthroplasty;  Surgeon: Melita Drivers, MD;  Location: [facility];  Service: Orthopedics;  Laterality: Right;   SHOULDER ARTHROSCOPY     TOTAL SHOULDER ARTHROPLASTY     Right- Failed   Family History History reviewed. No pertinent family history.  Social History Social History   Tobacco Use   Smoking status: Every Day    Current packs/day: 0.50    Types: Cigarettes   Smokeless tobacco: Former  Building services engineer status: Never Used  Substance Use Topics   Alcohol use: Not Currently    Comment: occasional    Drug use: No   Allergies Fentanyl , Morphine and codeine, Robaxin [methocarbamol], Solu-medrol [methylprednisolone], Toradol [ketorolac tromethamine], and Tramadol  Review of Systems A thorough review of systems was obtained and all systems are negative except as noted in the HPI and PMH.   Physical Exam Vital Signs  I have reviewed the triage vital signs BP 129/89 (BP Location: Left Arm)   Pulse 100   Temp 97.7 F (36.5 C) (Oral)   Resp 18   Ht 6' (1.829 m)   Wt 95.3 kg   SpO2 99%   BMI 28.48 kg/m  Physical Exam Vitals and nursing note reviewed.  Constitutional:      General: He is not in acute distress.    Appearance: Normal appearance. He is well-developed. He is not ill-appearing.  HENT:     Head: Normocephalic and atraumatic.     Right Ear: External ear normal.     Left Ear: External ear normal.     Nose: Nose normal.     Mouth/Throat:     Mouth: Mucous membranes are moist.  Eyes:     General: No scleral icterus.  Right eye: No discharge.         Left eye: No discharge.  Cardiovascular:     Rate and Rhythm: Normal rate.  Pulmonary:     Effort: Pulmonary effort is normal. No respiratory distress.     Breath sounds: No stridor.  Abdominal:     General: Abdomen is flat. There is no distension.     Tenderness: There is no guarding.  Musculoskeletal:        General: No deformity.     Cervical back: No rigidity.       Back:  Skin:    General: Skin is warm and dry.     Coloration: Skin is not cyanotic, jaundiced or pale.  Neurological:     Mental Status: He is alert.  Psychiatric:        Speech: Speech normal.        Behavior: Behavior normal. Behavior is cooperative.     ED Results and Treatments Labs (all labs ordered are listed, but only abnormal results are displayed) Labs Reviewed - No data to display                                                                                                                         Radiology CT Lumbar Spine Wo Contrast Result Date: 12/11/2023 EXAM: CT OF THE LUMBAR SPINE WITHOUT CONTRAST 12/11/2023 12:19:37 PM TECHNIQUE: CT of the lumbar spine was performed without the administration of intravenous contrast. Multiplanar reformatted images are provided for review. Automated exposure control, iterative reconstruction, and/or weight based adjustment of the mA/kV was utilized to reduce the radiation dose to as low as reasonably achievable. COMPARISON: CT abdomen and pelvis 02/07/2023. CLINICAL HISTORY: Low back pain, prior surgery, new symptoms. Reports having disc surgery on 9/3. Reports back pain since, worsened the last 3 days. Denies urinary symptoms, fevers, chest pain, SHOB, emesis. FINDINGS: BONES AND ALIGNMENT: Instrumented fusion extending from T11 to S1 and the bilateral iliacs. There are bilateral pedicle screws at each level in the spine with vertical interconnecting rods. Morcelated bone graft material within the posterolateral gutters. There is no evidence of solid osseous  fusion. Interbody spacer at L5-S1 with areas of new bone formation within the disc space without solid osseous fusion appreciated. Moderate levocurvature of the lumbar spine centered at L2-L3. Trace retrolisthesis of L4 on L5. Sequelae of laminectomy at L2-L3 and L3-L4. Vertebral body heights are maintained. Degenerative endplate osteophytes at multiple levels. Postsurgical changes in the posterior paraspinal soft tissues. Streak artifact from hardware limits evaluation of the paraspinal musculature and limits evaluation of the spinal canal at multiple levels. No acute fracture or suspicious bone lesion. DEGENERATIVE CHANGES: * **T12-L1**: There is a disc bulge and right paracentral osteophytes. There is right lateral recess narrowing. No significant spinal canal stenosis or osseous foraminal stenosis. *   **L1-L2**: There is no significant spinal canal or foraminal stenosis. *   **L2-L3**: There is a small disc bulge. No significant spinal canal or foraminal  stenosis. *   **L3-L4**: There is a small disc bulge. There is likely lateral recess narrowing. Limited evaluation of the spinal canal due to streak artifact. Posterior osteophytes and facet arthrosis and disc bulge contributing to moderate foraminal stenosis on the right. *   **L4-L5**: There is trace spondylolisthesis. Diffuse disc bulge and posterior osteophytes. Limited evaluation of the spinal canal due to streak artifact. There is likely at least mild lateral recess narrowing and spinal canal narrowing. Disc bulge and posterior osteophytes and facet arthrosis contribute to moderate foraminal stenosis on the right. There is mild foraminal stenosis on the left. *   **L5-S1**: There is no significant spinal canal stenosis. Disc bulge and prominent posterior osteophytes along with facet arthrosis contributing to moderate foraminal stenosis on the left. SOFT TISSUES: Postsurgical changes in the posterior paraspinal soft tissues. Streak artifact from hardware  limits evaluation of the paraspinal musculature. Mild scattered atherosclerosis of the abdominal aorta and branch vessels. No acute abnormality. IMPRESSION: 1. Instrumented fusion from T11 to S1 with bilateral iliac fixation without solid osseous fusion. 2. Interbody spacer at L5-S1 with new bone formation within the disc space without solid osseous fusion. 3. Degenerative changes as above. Limited evaluation of the spinal canal at multiple levels due to streak artifact. 4. Moderate foraminal stenosis on the right at L3-4 and L4-5 and on the left at L5-S1. 5. Moderate levocurvature of the lumbar spine centered at L2-3. Electronically signed by: Donnice Mania MD 12/11/2023 12:54 PM EDT RP Workstation: [id number]    Pertinent labs & imaging results that were available during my care of the patient were reviewed by me and considered in my medical decision making (see MDM for details).  Medications Ordered in ED Medications  lidocaine  (LIDODERM ) 5 % 1 patch (1 patch Transdermal Patch Applied 12/11/23 1400)  HYDROmorphone  (DILAUDID ) injection 1 mg (1 mg Intramuscular Given 12/11/23 1357)  ondansetron  (ZOFRAN -ODT) disintegrating tablet 4 mg (4 mg Oral Given 12/11/23 1400)                                                                                                                                     Procedures Procedures  (including critical care time)  Medical Decision Making / ED Course    Medical Decision Making:    Mark Kelley is a 63 y.o. male with past medical history as below, significant for COPD, DM2, GERD, HLD, HTN, status post bilateral L5-S1 transforaminal lumbar interbody fusion and bone graft Dr. Marlyce @[facility]  Who presents to the ED with complaint of low back pain. The complaint involves an extensive differential diagnosis and also carries with it a high risk of complications and morbidity.  Serious etiology was considered. Ddx includes but is not limited to: Differential  diagnosis includes but is not exclusive to musculoskeletal back pain, renal colic, urinary tract infection, pyelonephritis, intra-abdominal causes of back pain, aortic aneurysm or dissection, cauda equina syndrome, sciatica, lumbar disc  disease, thoracic disc disease, etc.   Complete initial physical exam performed, notably the patient was in NAD.    Reviewed and confirmed nursing documentation for past medical history, family history, social history.  Vital signs reviewed.    Chronic low back pain > - Multiple recent ER visits for low back pain following his surgery last month.  No fever, exam is reassuring.  CT imaging of his lumbar spine is reassuring.  Initially patient reported that he was only taking Tylenol  for his back pain, reports that he has run out of his oxycodone  that was prescribed earlier this month. Will provide pt analgesia here but advised him to f/u with his spine specialist/pain management regarding further outpatient rx for opiates. Pt was agreeable to this plan. Feeling better after analgesia and is stable for dc, wife will take him home        Patient presents with low back pain without signs of spinal cord compression, cauda equina syndrome, infection, aneurysm, or other serious etiology. The patient is neurologically intact. Given the extremely low risk of these diagnoses further testing and evaluation for these possibilities does not appear to be indicated at this time. Detailed discussions were had with the patient and/or family and caregivers, regarding current findings, and need for close f/u with PCP or on call doctor. The patient has been instructed to return immediately if the symptoms worsen in any way. Patient verbalized understanding and is in agreement with current care plan. All questions answered prior to discharge.               Additional history obtained: -Additional history obtained from na -External records from outside source obtained and  reviewed including: Chart review including previous notes, labs, imaging, consultation notes including  Prior ER visits from [place], PDMP   Lab Tests: na  EKG   EKG Interpretation Date/Time:    Ventricular Rate:    PR Interval:    QRS Duration:    QT Interval:    QTC Calculation:   R Axis:      Text Interpretation:           Imaging Studies ordered: I ordered imaging studies including CT lumbar I independently visualized the following imaging with scope of interpretation limited to determining acute life threatening conditions related to emergency care; findings noted above I agree with the radiologist interpretation If any imaging was obtained with contrast I closely monitored patient for any possible adverse reaction a/w contrast administration in the emergency department   Medicines ordered and prescription drug management: Meds ordered this encounter  Medications   HYDROmorphone  (DILAUDID ) injection 1 mg   ondansetron  (ZOFRAN -ODT) disintegrating tablet 4 mg   lidocaine  (LIDODERM ) 5 % 1 patch    -I have reviewed the patients home medicines and have made adjustments as needed   Consultations Obtained: na   Cardiac Monitoring: Continuous pulse oximetry interpreted by myself, 99% on RA.    Social Determinants of Health:  Diagnosis or treatment significantly limited by social determinants of health: current smoker   Reevaluation: After the interventions noted above, I reevaluated the patient and found that they have improved  Co morbidities that complicate the patient evaluation  Past Medical History:  Diagnosis Date   Bruised kidney    Chronic back pain    COPD (chronic obstructive pulmonary disease) (HCC)    Diabetes mellitus without complication (HCC)    Type II   GERD (gastroesophageal reflux disease)    History of hiatal hernia  Hyperlipemia    Hypertension    Scoliosis       Dispostion: Disposition decision including need for  hospitalization was considered, and patient discharged from emergency department.    Final Clinical Impression(s) / ED Diagnoses Final diagnoses:  Chronic midline low back pain without sciatica        Elnor Jayson LABOR, DO 12/11/23 1422

## 2023-12-11 NOTE — ED Notes (Signed)
 Per Dr. Elnor, urinalysis is not needed. Order is to be cancelled.

## 2023-12-11 NOTE — ED Triage Notes (Signed)
 Reports having disc surgery on 9/3. Reports back pain since, worsened the last 3 days.  Denies urinary symptoms, fevers, chest pain, SHOB, emesis

## 2024-01-07 ENCOUNTER — Encounter (HOSPITAL_BASED_OUTPATIENT_CLINIC_OR_DEPARTMENT_OTHER): Payer: Self-pay

## 2024-01-07 ENCOUNTER — Other Ambulatory Visit: Payer: Self-pay

## 2024-01-07 ENCOUNTER — Other Ambulatory Visit (HOSPITAL_BASED_OUTPATIENT_CLINIC_OR_DEPARTMENT_OTHER): Payer: Self-pay

## 2024-01-07 ENCOUNTER — Emergency Department (HOSPITAL_BASED_OUTPATIENT_CLINIC_OR_DEPARTMENT_OTHER): Admission: EM | Admit: 2024-01-07 | Discharge: 2024-01-07 | Disposition: A | Discharge status: Home / Self Care

## 2024-01-07 DIAGNOSIS — R238 Other skin changes: Secondary | ICD-10-CM

## 2024-01-07 DIAGNOSIS — Z7982 Long term (current) use of aspirin: Secondary | ICD-10-CM | POA: Insufficient documentation

## 2024-01-07 DIAGNOSIS — L988 Other specified disorders of the skin and subcutaneous tissue: Secondary | ICD-10-CM | POA: Diagnosis present

## 2024-01-07 DIAGNOSIS — N481 Balanitis: Secondary | ICD-10-CM | POA: Insufficient documentation

## 2024-01-07 MED ORDER — HYDROCORTISONE 1 % EX CREA
TOPICAL_CREAM | CUTANEOUS | 0 refills | Status: AC
  Filled 2024-01-07: qty 28, 7d supply, fill #0

## 2024-01-07 MED ORDER — CYCLOBENZAPRINE HCL 10 MG PO TABS
10.0000 mg | ORAL_TABLET | Freq: Three times a day (TID) | ORAL | 0 refills | Status: AC | PRN
  Filled 2024-01-07: qty 21, 7d supply, fill #0

## 2024-01-07 NOTE — ED Provider Notes (Signed)
 [place] EMERGENCY DEPARTMENT AT [facility] Provider Note   CSN: [id number] Arrival date & time: 01/07/24  9248     Patient presents with: skin irritation   Mark Kelley is a 63 y.o. male.   63 year old male presents for evaluation of skin irritation on his lower back/tailbone.  Had recent surgery on his back and states since then he has had some skin irritation.  States he is been using diaper rash cream has not been helping.  Also mentions balanitis and states that the clotrimazole cream is not helping.  He denies any other symptoms or concerns.        Prior to Admission medications   Medication Sig Start Date End Date Taking? Authorizing Provider  cyclobenzaprine (FLEXERIL) 10 MG tablet Take 1 tablet (10 mg total) by mouth 3 (three) times daily as needed for up to 7 days for muscle spasms. 01/07/24 01/14/24 Yes Matsue Strom L, DO  hydrocortisone cream 1 % Apply to affected area 2 times daily x1 week 01/07/24  Yes Herold Salguero L, DO  albuterol  (PROVENTIL  HFA;VENTOLIN  HFA) 108 (90 Base) MCG/ACT inhaler Inhale 1-2 puffs into the lungs every 4 (four) hours as needed for wheezing or shortness of breath.    [provider]  amitriptyline  (ELAVIL ) 50 MG tablet Take 1 tablet (50 mg total) by mouth at bedtime. 09/09/17   Baxter Drivers, MD  aspirin  EC 81 MG tablet Take 81 mg by mouth daily.    [provider]  Cholecalciferol (VITAMIN D PO) Take 1 tablet by mouth.    [provider]  famotidine  (PEPCID ) 40 MG tablet TK 1 T PO HS 08/20/18   [provider]  fenofibrate (TRICOR) 145 MG tablet Take 145 mg by mouth daily.    [provider]  gabapentin  (NEURONTIN ) 600 MG tablet Take 600 mg by mouth 4 (four) times daily.     [provider]  lidocaine  (LIDODERM ) 5 % Place 1 patch onto the skin daily as needed (leg cramps). Remove & Discard patch within 12 hours or as directed by MD    [provider]   losartan -hydrochlorothiazide  (HYZAAR) 100-12.5 MG tablet Take 1 tablet by mouth daily.    [provider]  metFORMIN (GLUCOPHAGE) 500 MG tablet Take 500 mg by mouth 2 (two) times daily with a meal.     [provider]  omeprazole (PRILOSEC) 40 MG capsule Take 40 mg by mouth daily.    [provider]  oxyCODONE -acetaminophen  (PERCOCET/ROXICET) 5-325 MG tablet Take 1 tablet by mouth every 6 (six) hours as needed for severe pain (pain score 7-10). 02/07/23   Donnajean Lynwood DEL, PA-C  OZEMPIC, 0.25 OR 0.5 MG/DOSE, 2 MG/1.5ML SOPN INJECT 0.25 MG WEEKLY FOR 2 WEEKS THEN 0.5 MG WEEKLY 08/23/18   [provider]  pravastatin  (PRAVACHOL ) 80 MG tablet Take 80 mg by mouth daily.    [provider]  sucralfate  (CARAFATE ) 1 g tablet Take 1 g by mouth daily.  04/12/17   [provider]  tiZANidine  (ZANAFLEX ) 4 MG tablet Take 1 tablet (4 mg total) by mouth every 6 (six) hours as needed for muscle spasms. 09/04/17   Shuford, Randine, PA-C  zolpidem  (AMBIEN ) 10 MG tablet Take 10 mg by mouth at bedtime.  04/02/17   [provider]    Allergies: Fentanyl , Morphine and codeine, Robaxin [methocarbamol], Solu-medrol [methylprednisolone], Toradol [ketorolac tromethamine], and Tramadol    Review of Systems  Constitutional:  Negative for chills and fever.  HENT:  Negative for ear pain and sore throat.   Eyes:  Negative for pain and visual disturbance.  Respiratory:  Negative for cough and shortness of breath.   Cardiovascular:  Negative for chest pain and palpitations.  Gastrointestinal:  Negative for abdominal pain and vomiting.  Genitourinary:  Negative for dysuria and hematuria.  Musculoskeletal:  Negative for arthralgias and back pain.  Skin:  Negative for color change and rash.  Neurological:  Negative for seizures and syncope.  All other systems reviewed and are negative.   Updated Vital Signs BP 137/77   Pulse 92   Temp 97.8 F (36.6 C) (Oral)    Resp 18   Ht 6' (1.829 m)   Wt 95.3 kg   SpO2 95%   BMI 28.48 kg/m   Physical Exam Vitals and nursing note reviewed.  Constitutional:      General: He is not in acute distress.    Appearance: He is well-developed.  HENT:     Head: Normocephalic and atraumatic.  Eyes:     Conjunctiva/sclera: Conjunctivae normal.  Cardiovascular:     Rate and Rhythm: Normal rate and regular rhythm.     Heart sounds: No murmur heard. Pulmonary:     Effort: Pulmonary effort is normal. No respiratory distress.     Breath sounds: Normal breath sounds.  Abdominal:     Palpations: Abdomen is soft.     Tenderness: There is no abdominal tenderness.  Genitourinary:    Comments: Patient deferred genital exam, lower back/tailbone region appears to be excoriated irritated with some scabbing Musculoskeletal:        General: No swelling.     Cervical back: Neck supple.  Skin:    General: Skin is warm and dry.     Capillary Refill: Capillary refill takes less than 2 seconds.  Neurological:     Mental Status: He is alert.  Psychiatric:        Mood and Affect: Mood normal.     (all labs ordered are listed, but only abnormal results are displayed) Labs Reviewed - No data to display  EKG: None  Radiology: No results found.   Procedures   Medications Ordered in the ED - No data to display                                  Medical Decision Making Patient here for skin irritation of his back and tailbone as well as balanitis.  He deferred genital exam.  Will switch him to hydrocortisone cream and I put in a referral for urology for the balanitis.  Given strict return precautions for phimosis or paraphimosis.  Also educated on nurse, mental health.  And advised him to use a moisturizing Aquaphor or Vaseline for his tailbone region as it appears very excoriated and scabbing at this time but no other acute abnormality.  Will switch his muscle relaxer to Flexeril as he does not like the tizanidine .  Advised  to return for new or worsening symptoms.  He feels comfortable being discharged home.  Problems Addressed: Balanitis: acute illness or injury Skin irritation: self-limited or minor problem  Amount and/or Complexity of Data Reviewed External Data Reviewed: notes.    Details: Prior hospital records reviewed and patient recently had back surgery  Risk OTC drugs. Prescription drug management.     Final diagnoses:  Skin irritation  Balanitis    ED Discharge Orders  Ordered    hydrocortisone cream 1 %        01/07/24 0822    cyclobenzaprine (FLEXERIL) 10 MG tablet  3 times daily PRN        01/07/24 9177    Ambulatory referral to Urology       Comments: Patient with balanitis not improving with clotrimazole   01/07/24 0823               Gennaro Duwaine CROME, DO 01/07/24 [id number]

## 2024-01-07 NOTE — Discharge Instructions (Addendum)
 You can use your hydrocortisone cream twice a day and continue to keep the area of your genitals clean.  Call the urology office to make an appointment.  Our referral was placed in the emergency department.  If at any point your foreskin gets stuck you need to return to the emergency department.  You can take your Flexeril as needed for pain and also alternate Tylenol  Motrin  as needed for pain.  For your skin irritation on your back/tailbone you should use Aquaphor or Vaseline over the area multiple times a day

## 2024-01-07 NOTE — ED Triage Notes (Signed)
 Pt states that he had back surgery. States that about 4 days ago he noted that he has some irritation in on his buttocks area. States that he also has a yeast infection and the cream he is taking is not working.

## 2024-03-03 ENCOUNTER — Other Ambulatory Visit: Payer: Self-pay

## 2024-03-03 DIAGNOSIS — I739 Peripheral vascular disease, unspecified: Secondary | ICD-10-CM

## 2024-03-14 ENCOUNTER — Encounter (HOSPITAL_BASED_OUTPATIENT_CLINIC_OR_DEPARTMENT_OTHER): Payer: Self-pay

## 2024-03-14 ENCOUNTER — Other Ambulatory Visit: Payer: Self-pay

## 2024-03-14 ENCOUNTER — Emergency Department (HOSPITAL_BASED_OUTPATIENT_CLINIC_OR_DEPARTMENT_OTHER)

## 2024-03-14 ENCOUNTER — Emergency Department (HOSPITAL_BASED_OUTPATIENT_CLINIC_OR_DEPARTMENT_OTHER)
Admission: EM | Admit: 2024-03-14 | Discharge: 2024-03-14 | Disposition: A | Discharge status: Home / Self Care | Attending: Emergency Medicine | Admitting: Emergency Medicine

## 2024-03-14 DIAGNOSIS — T85848A Pain due to other internal prosthetic devices, implants and grafts, initial encounter: Secondary | ICD-10-CM | POA: Diagnosis not present

## 2024-03-14 DIAGNOSIS — M5442 Lumbago with sciatica, left side: Secondary | ICD-10-CM | POA: Diagnosis not present

## 2024-03-14 DIAGNOSIS — J449 Chronic obstructive pulmonary disease, unspecified: Secondary | ICD-10-CM | POA: Diagnosis not present

## 2024-03-14 DIAGNOSIS — Z79899 Other long term (current) drug therapy: Secondary | ICD-10-CM | POA: Diagnosis not present

## 2024-03-14 DIAGNOSIS — G8929 Other chronic pain: Secondary | ICD-10-CM | POA: Diagnosis not present

## 2024-03-14 DIAGNOSIS — Z7984 Long term (current) use of oral hypoglycemic drugs: Secondary | ICD-10-CM | POA: Diagnosis not present

## 2024-03-14 DIAGNOSIS — Z7982 Long term (current) use of aspirin: Secondary | ICD-10-CM | POA: Insufficient documentation

## 2024-03-14 DIAGNOSIS — I10 Essential (primary) hypertension: Secondary | ICD-10-CM | POA: Insufficient documentation

## 2024-03-14 DIAGNOSIS — Y792 Prosthetic and other implants, materials and accessory orthopedic devices associated with adverse incidents: Secondary | ICD-10-CM | POA: Insufficient documentation

## 2024-03-14 DIAGNOSIS — E119 Type 2 diabetes mellitus without complications: Secondary | ICD-10-CM | POA: Insufficient documentation

## 2024-03-14 DIAGNOSIS — M545 Low back pain, unspecified: Secondary | ICD-10-CM | POA: Diagnosis present

## 2024-03-14 MED ORDER — OXYCODONE HCL 5 MG PO TABS
20.0000 mg | ORAL_TABLET | Freq: Once | ORAL | Status: AC
  Administered 2024-03-14: 20 mg via ORAL
  Filled 2024-03-14: qty 4

## 2024-03-14 MED ORDER — HYDROMORPHONE HCL 1 MG/ML IJ SOLN
0.5000 mg | Freq: Once | INTRAMUSCULAR | Status: AC
  Administered 2024-03-14: 0.5 mg via INTRAMUSCULAR
  Filled 2024-03-14: qty 1

## 2024-03-14 NOTE — ED Provider Notes (Signed)
 " [facility] Provider Note   CSN: [id number] Arrival date & time: 03/14/24  1534     Patient presents with: Back Pain   Mark Kelley is a 64 y.o. male.  Back Pain 63 year old male presenting today for concerns for acute on chronic sharp low back pain rating down left leg and to left shoulder that has been ongoing since spine surgery in September.  Has been seen multiple times in the ER for recurrent pain noting to have a coming appointments with spine surgeon, pain management, PCP in February.  Has yet to see them since the new year.  Seen in the ER yesterday but came in today due to pain worsening, causing him to have excruciating pain.  Noted to have had falls last year but had no scans since.    Denies any red flag symptoms including: Trauma to the area, dysuria, fever, IVD use, hematuria, unexplained weight loss, fecal/urinary incontinence, saddle paresthesia, lower extremity weakness.  Previous medical history of COPD, chronic back pain, GERD, scoliosis, diabetes, HLD, HTN.   Additionally denying chest pain, shortness of breath, abdominal pain, nausea, vomiting, diarrhea, melena, hematochezia, lower leg swelling.   Additionally noting that he has had stopped taking his blood pressure medications due to having been prescribed to pick up medications which he was told to not take with blood pressure medications.  Has not restarted.  Does have follow-up with PCP.  Prior to Admission medications  Medication Sig Start Date End Date Taking? Authorizing Provider  Oxycodone  HCl 20 MG TABS Take 1 tablet by mouth every 4 (four) hours as needed. 02/21/24  Yes [provider]  albuterol  (PROVENTIL  HFA;VENTOLIN  HFA) 108 (90 Base) MCG/ACT inhaler Inhale 1-2 puffs into the lungs every 4 (four) hours as needed for wheezing or shortness of breath.    [provider]  amitriptyline  (ELAVIL ) 50 MG tablet Take 1 tablet (50 mg total)  by mouth at bedtime. 09/09/17   Baxter Drivers, MD  aspirin  EC 81 MG tablet Take 81 mg by mouth daily.    [provider]  Cholecalciferol (VITAMIN D PO) Take 1 tablet by mouth.    [provider]  Eszopiclone 3 MG TABS Take 3 mg by mouth at bedtime.    [provider]  famotidine  (PEPCID ) 40 MG tablet TK 1 T PO HS 08/20/18   [provider]  FARXIGA 10 MG TABS tablet Take 10 mg by mouth daily.    [provider]  fenofibrate (TRICOR) 145 MG tablet Take 145 mg by mouth daily.    [provider]  gabapentin  (NEURONTIN ) 600 MG tablet Take 600 mg by mouth 4 (four) times daily.     [provider]  hydrocortisone  cream 1 % Apply to affected area 2 times daily for 1 week. 01/07/24   Kammerer, Megan L, DO  lidocaine  (LIDODERM ) 5 % Place 1 patch onto the skin daily as needed (leg cramps). Remove & Discard patch within 12 hours or as directed by MD    [provider]  losartan -hydrochlorothiazide  (HYZAAR) 100-12.5 MG tablet Take 1 tablet by mouth daily.    [provider]  metFORMIN (GLUCOPHAGE) 500 MG tablet Take 500 mg by mouth 2 (two) times daily with a meal.     [provider]  omeprazole (PRILOSEC) 40 MG capsule Take 40 mg by mouth daily.    [provider]  oxyCODONE -acetaminophen  (PERCOCET/ROXICET) 5-325 MG tablet Take 1 tablet by mouth every  6 (six) hours as needed for severe pain (pain score 7-10). 02/07/23   Donnajean Lynwood DEL, PA-C  OZEMPIC, 0.25 OR 0.5 MG/DOSE, 2 MG/1.5ML SOPN INJECT 0.25 MG WEEKLY FOR 2 WEEKS THEN 0.5 MG WEEKLY 08/23/18   [provider]  pravastatin  (PRAVACHOL ) 80 MG tablet Take 80 mg by mouth daily.    [provider]  sucralfate  (CARAFATE ) 1 g tablet Take 1 g by mouth daily.  04/12/17   [provider]  tiZANidine  (ZANAFLEX ) 4 MG tablet Take 1 tablet (4 mg total) by mouth every 6 (six) hours as needed for muscle spasms. 09/04/17   Shuford, Randine, PA-C   zolpidem  (AMBIEN ) 10 MG tablet Take 10 mg by mouth at bedtime.  04/02/17   [provider]    Allergies: Fentanyl , Morphine and codeine, Robaxin [methocarbamol], Solu-medrol [methylprednisolone], Toradol [ketorolac tromethamine], and Tramadol    Review of Systems  Musculoskeletal:  Positive for back pain.  All other systems reviewed and are negative.   Updated Vital Signs BP (!) 168/101 (BP Location: Right Arm)   Pulse 85   Temp 97.6 F (36.4 C) (Oral)   Resp 12   Ht 6' (1.829 m)   Wt 95.3 kg   SpO2 100%   BMI 28.49 kg/m   Physical Exam Vitals and nursing note reviewed.  Constitutional:      General: He is not in acute distress.    Appearance: Normal appearance. He is not ill-appearing or diaphoretic.  HENT:     Head: Normocephalic and atraumatic.  Eyes:     General: No scleral icterus.       Right eye: No discharge.        Left eye: No discharge.     Extraocular Movements: Extraocular movements intact.     Conjunctiva/sclera: Conjunctivae normal.  Cardiovascular:     Rate and Rhythm: Normal rate and regular rhythm.     Pulses: Normal pulses.     Heart sounds: Normal heart sounds. No murmur heard.    No friction rub. No gallop.  Pulmonary:     Effort: Pulmonary effort is normal. No respiratory distress.     Breath sounds: No stridor. No wheezing, rhonchi or rales.  Chest:     Chest wall: No tenderness.  Abdominal:     General: Abdomen is flat. There is no distension.     Palpations: Abdomen is soft.     Tenderness: There is no abdominal tenderness. There is no right CVA tenderness, left CVA tenderness, guarding or rebound.  Musculoskeletal:        General: Tenderness present. No swelling, deformity or signs of injury.     Cervical back: Normal range of motion. No rigidity.     Right lower leg: No edema.     Left lower leg: No edema.     Comments: Noted to have straight leg test positive bilaterally.  With notably tender over midline of lumbar spine.   Surgical scars present, well-healing without any signs of active infection.    Skin:    General: Skin is warm and dry.     Findings: No bruising, erythema or lesion.  Neurological:     General: No focal deficit present.     Mental Status: He is alert and oriented to person, place, and time. Mental status is at baseline.     Motor: No weakness.     Comments: Notable does not have any decreased sensation to bilateral lower extremities and has 5 out of 5 strength of  both flexion extension bilaterally to both lower extremities.  Good grip shin bilaterally.  Good sensation to bilateral upper extremities as well.  Psychiatric:        Mood and Affect: Mood normal.     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 Multiplanar reformatted images are provided for review. Automated exposure control, iterative reconstruction, and/or weight based adjustment of the mA/kV was utilized to reduce the radiation dose to as low as reasonably achievable. COMPARISON: 12/11/2023 CLINICAL HISTORY: midline low back pain and tenderness with extensive surgical Hx to Lumbar spine. Straight leg test positive bilaterally. No signs or Sx of cauda equina, reports a fall. FINDINGS: BONES AND ALIGNMENT: Normal vertebral body heights. 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 Lucency noted  around the S1 screws and bilateral iliac fixation screws compatible with loosening. This is new since the prior study. Levoscoliosis of the lumbar spine again noted, unchanged. No acute fracture. 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 SOFT TISSUES: No acute abnormality. IMPRESSION: 1. No acute fracture. 2. Changes of posterior fusion T11 to S1 with bilateral iliac fixations. New lucency around the S1 screws and bilateral iliac fixation screws compatible with loosening. Electronically signed by: Franky Crease MD 03/14/2024 05:37 PM EST RP Workstation: [id number]    Procedures   Medications Ordered in the ED  oxyCODONE  (Oxy IR/ROXICODONE ) immediate release tablet 20 mg (20 mg Oral Given 03/14/24 1654)  HYDROmorphone  (DILAUDID ) injection 0.5 mg (0.5 mg Intramuscular Given 03/14/24 1734)   Medical Decision Making This patient is a 64 year old male who presents to the ED for concern of acute on chronic low back pain rating down left leg described as sharp and also endorsing some left shoulder pain which is new since yesterday after being previously Emergency Department for same issue.  Noted to have had chronic pain and is out of chronic pain medication, with follow-up appointments with both pain management, spine surgery in early February.  Reportedly by patient, here today with spouse to also need to be seen for pain issue.  On physical exam, patient is in no acute distress, afebrile, alert and orient x 4, speaking in full sentences, nontachypneic, nontachycardic.  Notably does have focal tenderness to lumbar spine, midline without any signs of crepitus or step-off.  Additionally having straight leg test positive bilaterally.  Normal sensation bilaterally normal strength bilaterally, normal grip strength bilaterally.  No signs of cauda equina, however due to patient's increased pain and reportedly having not had any imaging since his falls last year,  we will repeat CT imaging.  Additionally providing pain control here and getting EKG with patient having risk factors for ACS with some left-sided shoulder pain.  However low suspicion with no exertional symptoms and no chest pain.  Will have continue to follow-up with his PCP for blood pressure management, recommending that he continue to  take logs of his blood pressure.  CT scan noted to have possible screw loosening.  Will have continued follow-up with orthopedic surgery tomorrow.  Additionally, patient wished to have his gabapentin  refilled noting that he had not been able to take it for the last 2 weeks due to running out.  Currently takes 600 mg.  Will not refill due to patient pain medication being managed by spine surgery and pain management, and having had been off medication for 2 weeks will likely need new dosing.  Discussed case with attending who agrees with plan.  Patient vital signs have remained stable throughout the course of patient's time in the ED. Low suspicion for any other emergent pathology at this time. I believe this patient is safe to be discharged. Provided strict return to ER precautions. Patient expressed agreement and understanding of plan. All questions were answered.  Differential diagnoses prior to evaluation: The emergent differential diagnosis includes, but is not limited to, sciatica, low back strain, herniated disc, chronic pain, cauda equina syndrome, fracture, hardware malfunction. This is not an exhaustive differential.   Past Medical History / Co-morbidities / Social History: COPD, chronic back pain, GERD, scoliosis, diabetes, HLD, HTN  Additional history: Chart reviewed. Pertinent results include:   Additionally have noted to have been seen in the emergency department x 11 times in the last 3 months. Multiple times for low back pain.   Seen in the ED yesterday for low back pain, complaining of screws coming loose from prior surgery.  Reporting lack  of sleep secondary to pain x 6 days.  Noted to have run out of his oxycodone  20 mg tablets.  Reported that he cannot follow-up with his pain clinic until end of the month.  Provided Tylenol  and lidocaine  patches.  Most recent CT scan on 11/2023 noted instrumented fusion at T11 S1 with interbody spacer at L5-S1 with new bone formation within the disc space without solid osseous fusion.  Additionally some degenerative changes with moderate foraminal stenosis on right L3-L4 and L 4 to L5 and on the left at L5-S1.  Lab Tests/Imaging studies: I personally interpreted labs/imaging and the pertinent results include:   CT lumbar spine shows changes of posterior fusion T11-S1 with bilateral iliac fixations noted and seated on S1 screws and bilateral iliac fixation screws compatible with loosening.   I agree with the radiologist interpretation.  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  I have reviewed the patients home medicines and have made adjustments as needed.  Critical Interventions: None  Social Determinants of Health: Good follow-up with both spinal surgery and pain management  Disposition: After consideration of the diagnostic results and the patients response to treatment, I feel that the patient would benefit from discharge and treatment as above.   emergency department workup does not suggest an emergent condition requiring admission or immediate intervention beyond what has  been performed at this time. The plan is: Follow-up with specialists, return to ED for new or worsening  symptoms. The patient is safe for discharge and has been instructed to return immediately for worsening symptoms, change in symptoms or any other concerns.   Final diagnoses:  Chronic left-sided low back pain with left-sided sciatica  Pain from implanted hardware, initial encounter    ED Discharge Orders     None          Beola Terrall RAMAN, [place] 03/14/24 1807  "

## 2024-03-14 NOTE — ED Notes (Signed)
 Patient transferred from waiting room to ED treatment room. Assuming pt care at this time.

## 2024-03-14 NOTE — ED Notes (Signed)
 Patient requesting additional pain medication. Provider notified. Pending new orders.

## 2024-03-14 NOTE — ED Notes (Signed)
Patient transported to imaging at this time.

## 2024-03-14 NOTE — ED Triage Notes (Signed)
 Reports back surgery 10/2023. States was told screws were coming loose after getting CT scan 3 weeks ago. Reports continued back pain (from tailbone to shoulders) since.

## 2024-03-14 NOTE — Discharge Instructions (Addendum)
 Seen today for low back pain which CT scan shows to be possible screws loosening which is the cause of his pain.  Recommending call your orthopedic surgeon tomorrow to get seen earlier than your already scheduled appointment.  Additionally recommend you call them to discuss having gabapentin  sent in, with you having had stopped it for close to 2 weeks now, I am unable to prescribe gabapentin  at its current dose and you will have to continue to follow-up with your spinal surgery for new dosing and taper.  They will have access to all of our imaging that we have done today.  In the meantime continue to reach back out to pain management as well for additional pain relief.  Return to the care for new or worsening symptoms.

## 2024-03-28 ENCOUNTER — Ambulatory Visit (HOSPITAL_COMMUNITY)

## 2024-03-28 ENCOUNTER — Ambulatory Visit (HOSPITAL_COMMUNITY): Admitting: Surgery

## 2024-03-29 ENCOUNTER — Ambulatory Visit: Admitting: Urology
